# Patient Record
Sex: Male | Born: 1955 | Race: Black or African American | Hispanic: No | Marital: Married | Smoking: Current every day smoker
Health system: Southern US, Community
[De-identification: ages and names within clinical notes are randomized; demographics above are authoritative.]

## PROBLEM LIST (undated history)

## (undated) DIAGNOSIS — M549 Dorsalgia, unspecified: Secondary | ICD-10-CM

## (undated) DIAGNOSIS — I10 Essential (primary) hypertension: Secondary | ICD-10-CM

---

## 2007-09-01 ENCOUNTER — Emergency Department (HOSPITAL_COMMUNITY): Admission: EM | Admit: 2007-09-01 | Discharge: 2007-09-01 | Payer: Self-pay | Admitting: Emergency Medicine

## 2010-12-05 ENCOUNTER — Emergency Department (HOSPITAL_COMMUNITY)
Admission: EM | Admit: 2010-12-05 | Discharge: 2010-12-05 | Disposition: A | Discharge status: Home / Self Care | Payer: Self-pay | Attending: Emergency Medicine | Admitting: Emergency Medicine

## 2010-12-05 DIAGNOSIS — K2970 Gastritis, unspecified, without bleeding: Secondary | ICD-10-CM | POA: Insufficient documentation

## 2010-12-05 DIAGNOSIS — K2990 Gastroduodenitis, unspecified, without bleeding: Secondary | ICD-10-CM | POA: Insufficient documentation

## 2010-12-05 DIAGNOSIS — R1013 Epigastric pain: Secondary | ICD-10-CM | POA: Insufficient documentation

## 2010-12-05 DIAGNOSIS — I10 Essential (primary) hypertension: Secondary | ICD-10-CM | POA: Insufficient documentation

## 2010-12-05 DIAGNOSIS — E875 Hyperkalemia: Secondary | ICD-10-CM | POA: Insufficient documentation

## 2010-12-05 DIAGNOSIS — Z79899 Other long term (current) drug therapy: Secondary | ICD-10-CM | POA: Insufficient documentation

## 2010-12-05 LAB — URINALYSIS, ROUTINE W REFLEX MICROSCOPIC
Ketones, ur: 15 mg/dL — AB
Nitrite: NEGATIVE
Protein, ur: NEGATIVE mg/dL
Urobilinogen, UA: 1 mg/dL (ref 0.0–1.0)

## 2010-12-05 LAB — COMPREHENSIVE METABOLIC PANEL
AST: 50 U/L — ABNORMAL HIGH (ref 0–37)
Alkaline Phosphatase: 69 U/L (ref 39–117)
Calcium: 9.6 mg/dL (ref 8.4–10.5)
GFR calc Af Amer: >60 mL/min (ref >60)
GFR calc non Af Amer: >60 mL/min (ref >60)
Glucose, Bld: 82 mg/dL (ref 70–99)
Sodium: 139 mEq/L (ref 135–145)
Total Bilirubin: 0.5 mg/dL (ref 0.3–1.2)

## 2010-12-05 LAB — DIFFERENTIAL
Basophils Absolute: 0 10*3/uL (ref 0.0–0.1)
Basophils Relative: 1 % (ref 0–1)
Eosinophils Absolute: 0.1 10*3/uL (ref 0.0–0.7)
Eosinophils Relative: 1 % (ref 0–5)
Monocytes Absolute: 0.5 10*3/uL (ref 0.1–1.0)

## 2010-12-05 LAB — CBC
HCT: 47.2 % (ref 39.0–52.0)
MCHC: 35.2 g/dL (ref 30.0–36.0)
Platelets: 321 10*3/uL (ref 150–400)
RDW: 12.1 % (ref 11.5–15.5)

## 2014-04-19 ENCOUNTER — Emergency Department (HOSPITAL_COMMUNITY): Payer: Self-pay

## 2014-04-19 ENCOUNTER — Emergency Department (HOSPITAL_COMMUNITY)
Admission: EM | Admit: 2014-04-19 | Discharge: 2014-04-19 | Disposition: A | Discharge status: Home / Self Care | Payer: Self-pay | Attending: Emergency Medicine | Admitting: Emergency Medicine

## 2014-04-19 ENCOUNTER — Encounter (HOSPITAL_COMMUNITY): Payer: Self-pay | Admitting: Emergency Medicine

## 2014-04-19 DIAGNOSIS — R1013 Epigastric pain: Secondary | ICD-10-CM

## 2014-04-19 DIAGNOSIS — R110 Nausea: Secondary | ICD-10-CM | POA: Insufficient documentation

## 2014-04-19 DIAGNOSIS — Z720 Tobacco use: Secondary | ICD-10-CM | POA: Insufficient documentation

## 2014-04-19 DIAGNOSIS — I10 Essential (primary) hypertension: Secondary | ICD-10-CM | POA: Insufficient documentation

## 2014-04-19 DIAGNOSIS — R1011 Right upper quadrant pain: Secondary | ICD-10-CM

## 2014-04-19 HISTORY — DX: Essential (primary) hypertension: I10

## 2014-04-19 LAB — LIPASE, BLOOD: LIPASE: 30 U/L (ref 11–59)

## 2014-04-19 LAB — CBC WITH DIFFERENTIAL/PLATELET
Basophils Absolute: 0 10*3/uL (ref 0.0–0.1)
Basophils Relative: 1 % (ref 0–1)
EOS ABS: 0.2 10*3/uL (ref 0.0–0.7)
EOS PCT: 4 % (ref 0–5)
HEMATOCRIT: 46.1 % (ref 39.0–52.0)
HEMOGLOBIN: 16.3 g/dL (ref 13.0–17.0)
LYMPHS ABS: 2.2 10*3/uL (ref 0.7–4.0)
Lymphocytes Relative: 45 % (ref 12–46)
MCH: 31.2 pg (ref 26.0–34.0)
MCHC: 35.4 g/dL (ref 30.0–36.0)
MCV: 88.1 fL (ref 78.0–100.0)
MONOS PCT: 12 % (ref 3–12)
Monocytes Absolute: 0.6 10*3/uL (ref 0.1–1.0)
Neutro Abs: 1.8 10*3/uL (ref 1.7–7.7)
Neutrophils Relative %: 38 % — ABNORMAL LOW (ref 43–77)
Platelets: 314 10*3/uL (ref 150–400)
RBC: 5.23 MIL/uL (ref 4.22–5.81)
RDW: 13.4 % (ref 11.5–15.5)
WBC: 4.8 10*3/uL (ref 4.0–10.5)

## 2014-04-19 LAB — URINALYSIS, ROUTINE W REFLEX MICROSCOPIC
BILIRUBIN URINE: NEGATIVE
Glucose, UA: NEGATIVE mg/dL
Hgb urine dipstick: NEGATIVE
Ketones, ur: 15 mg/dL — AB
LEUKOCYTES UA: NEGATIVE
NITRITE: NEGATIVE
PH: 5.5 (ref 5.0–8.0)
Protein, ur: NEGATIVE mg/dL
SPECIFIC GRAVITY, URINE: 1.03 (ref 1.005–1.030)
Urobilinogen, UA: 1 mg/dL (ref 0.0–1.0)

## 2014-04-19 LAB — COMPREHENSIVE METABOLIC PANEL
ALT: 51 U/L (ref 0–53)
AST: 53 U/L — AB (ref 0–37)
Albumin: 3.8 g/dL (ref 3.5–5.2)
Alkaline Phosphatase: 94 U/L (ref 39–117)
Anion gap: 14 (ref 5–15)
BUN: 8 mg/dL (ref 6–23)
CALCIUM: 8.8 mg/dL (ref 8.4–10.5)
CO2: 23 meq/L (ref 19–32)
Chloride: 104 mEq/L (ref 96–112)
Creatinine, Ser: 0.93 mg/dL (ref 0.50–1.35)
GFR calc Af Amer: >90 mL/min (ref >90)
GFR calc non Af Amer: >90 mL/min (ref >90)
Glucose, Bld: 92 mg/dL (ref 70–99)
Potassium: 3.9 mEq/L (ref 3.7–5.3)
SODIUM: 141 meq/L (ref 137–147)
TOTAL PROTEIN: 7.1 g/dL (ref 6.0–8.3)
Total Bilirubin: 0.8 mg/dL (ref 0.3–1.2)

## 2014-04-19 LAB — I-STAT TROPONIN, ED: TROPONIN I, POC: 0.01 ng/mL (ref 0.00–0.08)

## 2014-04-19 LAB — TROPONIN I: Troponin I: <0.3 ng/mL (ref <0.30)

## 2014-04-19 LAB — POC OCCULT BLOOD, ED: Fecal Occult Bld: NEGATIVE

## 2014-04-19 MED ORDER — ONDANSETRON HCL 4 MG/2ML IJ SOLN
4.0000 mg | Freq: Once | INTRAMUSCULAR | Status: AC
  Administered 2014-04-19: 4 mg via INTRAVENOUS
  Filled 2014-04-19: qty 2

## 2014-04-19 MED ORDER — SODIUM CHLORIDE 0.9 % IV SOLN
1000.0000 mL | Freq: Once | INTRAVENOUS | Status: AC
  Administered 2014-04-19: 1000 mL via INTRAVENOUS

## 2014-04-19 MED ORDER — FENTANYL CITRATE 0.05 MG/ML IJ SOLN
50.0000 ug | Freq: Once | INTRAMUSCULAR | Status: AC
  Administered 2014-04-19: 50 ug via INTRAVENOUS
  Filled 2014-04-19: qty 2

## 2014-04-19 MED ORDER — PROMETHAZINE HCL 25 MG PO TABS: 25.0000 mg | ORAL_TABLET | Freq: Four times a day (QID) | ORAL | Status: DC | PRN

## 2014-04-19 MED ORDER — GI COCKTAIL ~~LOC~~
30.0000 mL | Freq: Once | ORAL | Status: AC
  Administered 2014-04-19: 30 mL via ORAL
  Filled 2014-04-19: qty 30

## 2014-04-19 MED ORDER — OMEPRAZOLE 20 MG PO CPDR: 20.0000 mg | DELAYED_RELEASE_CAPSULE | Freq: Every day | ORAL | Status: DC

## 2014-04-19 MED ORDER — MORPHINE SULFATE 4 MG/ML IJ SOLN
4.0000 mg | Freq: Once | INTRAMUSCULAR | Status: AC
  Administered 2014-04-19: 4 mg via INTRAVENOUS
  Filled 2014-04-19: qty 1

## 2014-04-19 MED ORDER — ONDANSETRON HCL 4 MG PO TABS: 4.0000 mg | ORAL_TABLET | Freq: Four times a day (QID) | ORAL | Status: DC

## 2014-04-19 MED ORDER — FAMOTIDINE 20 MG PO TABS
10.0000 mg | ORAL_TABLET | Freq: Once | ORAL | Status: AC
  Administered 2014-04-19: 10 mg via ORAL
  Filled 2014-04-19: qty 1

## 2014-04-19 NOTE — ED Notes (Signed)
Patient transported to Ultrasound 

## 2014-04-19 NOTE — ED Notes (Signed)
Patient presents with c/o abd pain for about 2 weeks  States his stools were green now they are black

## 2014-04-19 NOTE — Discharge Instructions (Signed)
Return to the Emergency Department immediately if you develop pain in your chest.

## 2014-04-19 NOTE — ED Provider Notes (Signed)
CSN: [id number]     Arrival date & time 04/19/14  0428 History   First MD Initiated Contact with Patient 04/19/14 [phone]     Chief Complaint  Patient presents with  . Abdominal Pain     (Consider location/radiation/quality/duration/timing/severity/associated sxs/prior Treatment) HPI Comments: Patient presents today with a chief complaint of epigastric abdominal pain that has been present for the past 2 weeks.  He reports that the pain is constant, but worse after eating.  He describes the pain as a burning sensation.  The pain does not radiate.  He reports that he has never had pain like this previously. He has not taken anything for his pain prior to arrival.  He reports nausea, but denies vomiting.  He reports that yesterday his BM appeared black in color.  He denies diarrhea or constipation.  Last BM was yesterday.  He denies fever or chills.  Denies chest pain or SOB.  He denies known history of Pancreatitis, Gallstones, or PUD.  The history is provided by the patient.    Past Medical History  Diagnosis Date  . Hypertension    History reviewed. No pertinent past surgical history. History reviewed. No pertinent family history. History  Substance Use Topics  . Smoking status: Current Every Day Smoker  . Smokeless tobacco: Never Used  . Alcohol Use: Yes    Review of Systems  All other systems reviewed and are negative.     Allergies  Review of patient's allergies indicates no known allergies.  Home Medications   Prior to Admission medications   Medication Sig Start Date End Date Taking? Authorizing Provider  ibuprofen (ADVIL,MOTRIN) 200 MG tablet Take 400 mg by mouth every 6 (six) hours as needed for mild pain.   Yes Historical Provider, MD   BP 128/93  Pulse 70  Temp(Src) 97.9 F (36.6 C) (Oral)  Resp 21  Ht 6' (1.829 m)  Wt 176 lb (79.833 kg)  BMI 23.86 kg/m2  SpO2 100% Physical Exam  Nursing note and vitals reviewed. Constitutional: He appears well-developed  and well-nourished.  HENT:  Head: Normocephalic and atraumatic.  Mouth/Throat: Oropharynx is clear and moist.  Neck: Normal range of motion. Neck supple.  Cardiovascular: Normal rate, regular rhythm and normal heart sounds.   Pulmonary/Chest: Effort normal and breath sounds normal.  Abdominal: Soft. Bowel sounds are normal. He exhibits no distension and no mass. There is tenderness in the right upper quadrant and epigastric area. There is no rebound and no guarding.  Genitourinary: Rectal exam shows no external hemorrhoid. Guaiac negative stool.  Neurological: He is alert.  Skin: Skin is warm and dry.  Psychiatric: He has a normal mood and affect.    ED Course  Procedures (including critical care time) Labs Review Labs Reviewed  COMPREHENSIVE METABOLIC PANEL - Abnormal; Notable for the following:    AST 53 (*)    All other components within normal limits  CBC WITH DIFFERENTIAL - Abnormal; Notable for the following:    Neutrophils Relative % 38 (*)    All other components within normal limits  TROPONIN I  LIPASE, BLOOD  URINALYSIS, ROUTINE W REFLEX MICROSCOPIC  OCCULT BLOOD X 1 CARD TO LAB, STOOL    Imaging Review [place] Abdomen Limited Ruq  04/19/2014   CLINICAL DATA:  RIGHT upper quadrant abdominal pain.  EXAM: US ABDOMEN LIMITED - RIGHT UPPER QUADRANT  COMPARISON:  None.  FINDINGS: Gallbladder:  Echogenic biliary sludge is present. No stones. Normal wall thickness. No sonographic Murphy sign.  Common  bile duct:  Diameter: Normal at 4 mm.  Liver:  Focal hyperechoic lesion is identified in the RIGHT hepatic lobe anteriorly, measuring 15 mm x 17 mm x 13 mm. The margins blend with surrounding parenchyma and there is no distortion of the associated vessels. This is an isolated finding.  IMPRESSION: 1. Biliary sludge.  Negative for cholelithiasis or cholecystitis. 2. 15 mm x 17 mm x 13 mm incidental RIGHT hepatic lobe hyperechoic lesion is nonspecific on ultrasound. Statistically, this  either likely represents focal fat or a benign hemangioma. In a patient without a given history of malignancy, 6 month followup ultrasound recommended to assess for stability.   Electronically Signed   By: Andreas NewportGeoffrey  Lamke M.D.   On: 04/19/2014 08:21     EKG Interpretation   Date/Time:  Monday April 19 2014 05:25:32 EDT Ventricular Rate:  72 PR Interval:  146 QRS Duration: 90 QT Interval:  398 QTC Calculation: 435 R Axis:   81 Text Interpretation:  Normal sinus rhythm `prominent/peaked  t wave v3-v4  Nonspecific ST abnormality No previous tracing Confirmed by Denton LankSTEINL  MD,  Caryn BeeKEVIN ([id number]) on 04/19/2014 9:17:58 AM     9:12 AM Patient reports that his pain has improved at this time. 10:50 AM Reassessed patient.  He reports that his pain is "much better."   Second troponin negative.  MDM   Final diagnoses:  RUQ abdominal pain   Patient presents today with a "burning" epigastric abdominal pain that has been present for the past 2 weeks.  Pain worse after eating.  Labs today unremarkable.  Ultrasound with no acute findings.  Patient informed of the right hepatic lobe lesion and the need for follow up.  EKG with non specific ST abnormality.  Initial and 3 hour troponin negative.  Pain improved significantly after given GI cocktail and Pepcid.  Feel that the patient is stable for discharge.  Patient given referral to GI.  Return precautions given.      Santiago GladHeather Shacoya Burkhammer, PA-C 04/20/14 2159

## 2014-04-19 NOTE — Discharge Planning (Signed)
[facility] Community Health & Eligibility Specialist  Spoke to patient regarding primary care resources and establishing care with a provider. Orange card application and instructions provided to the patient. Resource guide and my contact information also given for any future questions or concerns. No other Community Health & Eligibility Specialist needs identified at this time.

## 2014-04-21 NOTE — ED Provider Notes (Signed)
Medical screening examination/treatment/procedure(s) were performed by non-physician practitioner and as supervising physician I was immediately available for consultation/collaboration.   EKG Interpretation   Date/Time:  Monday April 19 2014 05:25:32 EDT Ventricular Rate:  72 PR Interval:  146 QRS Duration: 90 QT Interval:  398 QTC Calculation: 435 R Axis:   81 Text Interpretation:  Normal sinus rhythm `prominent/peaked  t wave v3-v4  Nonspecific ST abnormality No previous tracing Confirmed by Denton LankSTEINL  MD,  Caryn BeeKEVIN ([id number]) on 04/19/2014 9:17:58 AM        Shon Batonourtney F Emmalena Canny, MD 04/21/14 [phone]

## 2014-05-22 ENCOUNTER — Encounter (HOSPITAL_COMMUNITY): Payer: Self-pay | Admitting: Nurse Practitioner

## 2014-05-22 ENCOUNTER — Emergency Department (HOSPITAL_COMMUNITY)
Admission: EM | Admit: 2014-05-22 | Discharge: 2014-05-22 | Disposition: A | Discharge status: Home / Self Care | Payer: Self-pay | Attending: Emergency Medicine | Admitting: Emergency Medicine

## 2014-05-22 DIAGNOSIS — S199XXA Unspecified injury of neck, initial encounter: Secondary | ICD-10-CM

## 2014-05-22 DIAGNOSIS — Z720 Tobacco use: Secondary | ICD-10-CM | POA: Insufficient documentation

## 2014-05-22 DIAGNOSIS — Y9389 Activity, other specified: Secondary | ICD-10-CM | POA: Insufficient documentation

## 2014-05-22 DIAGNOSIS — S169XXA Unspecified injury of muscle, fascia and tendon at neck level, initial encounter: Secondary | ICD-10-CM | POA: Insufficient documentation

## 2014-05-22 DIAGNOSIS — Y998 Other external cause status: Secondary | ICD-10-CM | POA: Insufficient documentation

## 2014-05-22 DIAGNOSIS — I10 Essential (primary) hypertension: Secondary | ICD-10-CM | POA: Insufficient documentation

## 2014-05-22 DIAGNOSIS — Z79899 Other long term (current) drug therapy: Secondary | ICD-10-CM | POA: Insufficient documentation

## 2014-05-22 DIAGNOSIS — S39012A Strain of muscle, fascia and tendon of lower back, initial encounter: Secondary | ICD-10-CM | POA: Insufficient documentation

## 2014-05-22 DIAGNOSIS — Y92410 Unspecified street and highway as the place of occurrence of the external cause: Secondary | ICD-10-CM | POA: Insufficient documentation

## 2014-05-22 DIAGNOSIS — S1091XA Abrasion of unspecified part of neck, initial encounter: Secondary | ICD-10-CM | POA: Insufficient documentation

## 2014-05-22 MED ORDER — NAPROXEN 500 MG PO TABS: 500.0000 mg | ORAL_TABLET | Freq: Two times a day (BID) | ORAL | Status: DC

## 2014-05-22 MED ORDER — HYDROCODONE-ACETAMINOPHEN 5-325 MG PO TABS: 1.0000 | ORAL_TABLET | Freq: Four times a day (QID) | ORAL | Status: DC | PRN

## 2014-05-22 MED ORDER — CYCLOBENZAPRINE HCL 10 MG PO TABS: 10.0000 mg | ORAL_TABLET | Freq: Three times a day (TID) | ORAL | Status: DC | PRN
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
## 2014-05-31 ENCOUNTER — Ambulatory Visit (HOSPITAL_COMMUNITY)
Admission: EM | Admit: 2014-05-31 | Discharge: 2014-05-31 | Disposition: A | Discharge status: Home / Self Care | Payer: Self-pay | Attending: Emergency Medicine | Admitting: Emergency Medicine

## 2014-05-31 ENCOUNTER — Emergency Department (HOSPITAL_COMMUNITY): Payer: Self-pay | Admitting: Certified Registered Nurse Anesthetist

## 2014-05-31 ENCOUNTER — Emergency Department (HOSPITAL_COMMUNITY): Payer: Self-pay

## 2014-05-31 ENCOUNTER — Encounter (HOSPITAL_COMMUNITY): Payer: Self-pay | Admitting: Family Medicine

## 2014-05-31 ENCOUNTER — Encounter (HOSPITAL_COMMUNITY)
Admission: EM | Disposition: A | Discharge status: Home / Self Care | Payer: Self-pay | Source: Home / Self Care | Attending: Emergency Medicine

## 2014-05-31 DIAGNOSIS — S62522B Displaced fracture of distal phalanx of left thumb, initial encounter for open fracture: Secondary | ICD-10-CM | POA: Insufficient documentation

## 2014-05-31 DIAGNOSIS — X58XXXA Exposure to other specified factors, initial encounter: Secondary | ICD-10-CM | POA: Insufficient documentation

## 2014-05-31 DIAGNOSIS — I10 Essential (primary) hypertension: Secondary | ICD-10-CM | POA: Insufficient documentation

## 2014-05-31 DIAGNOSIS — S61112A Laceration without foreign body of left thumb with damage to nail, initial encounter: Secondary | ICD-10-CM | POA: Insufficient documentation

## 2014-05-31 DIAGNOSIS — F17210 Nicotine dependence, cigarettes, uncomplicated: Secondary | ICD-10-CM | POA: Insufficient documentation

## 2014-05-31 DIAGNOSIS — Z01810 Encounter for preprocedural cardiovascular examination: Secondary | ICD-10-CM

## 2014-05-31 DIAGNOSIS — S62639B Displaced fracture of distal phalanx of unspecified finger, initial encounter for open fracture: Secondary | ICD-10-CM

## 2014-05-31 DIAGNOSIS — Y929 Unspecified place or not applicable: Secondary | ICD-10-CM | POA: Insufficient documentation

## 2014-05-31 HISTORY — PX: TENDON EXPLORATION: SHX5112

## 2014-05-31 LAB — BASIC METABOLIC PANEL
ANION GAP: 13 (ref 5–15)
BUN: 9 mg/dL (ref 6–23)
CALCIUM: 9.5 mg/dL (ref 8.4–10.5)
CO2: 26 mEq/L (ref 19–32)
Chloride: 102 mEq/L (ref 96–112)
Creatinine, Ser: 0.96 mg/dL (ref 0.50–1.35)
GFR calc Af Amer: >90 mL/min (ref >90)
GFR calc non Af Amer: >90 mL/min (ref >90)
Glucose, Bld: 81 mg/dL (ref 70–99)
Potassium: 4 mEq/L (ref 3.7–5.3)
SODIUM: 141 meq/L (ref 137–147)

## 2014-05-31 LAB — CBC WITH DIFFERENTIAL/PLATELET
BASOS ABS: 0 10*3/uL (ref 0.0–0.1)
Basophils Relative: 0 % (ref 0–1)
EOS PCT: 2 % (ref 0–5)
Eosinophils Absolute: 0.1 10*3/uL (ref 0.0–0.7)
HCT: 46.5 % (ref 39.0–52.0)
Hemoglobin: 15.8 g/dL (ref 13.0–17.0)
LYMPHS PCT: 28 % (ref 12–46)
Lymphs Abs: 1.8 10*3/uL (ref 0.7–4.0)
MCH: 31.3 pg (ref 26.0–34.0)
MCHC: 34 g/dL (ref 30.0–36.0)
MCV: 92.3 fL (ref 78.0–100.0)
Monocytes Absolute: 0.7 10*3/uL (ref 0.1–1.0)
Monocytes Relative: 10 % (ref 3–12)
NEUTROS ABS: 3.8 10*3/uL (ref 1.7–7.7)
Neutrophils Relative %: 60 % (ref 43–77)
PLATELETS: 273 10*3/uL (ref 150–400)
RBC: 5.04 MIL/uL (ref 4.22–5.81)
RDW: 13.3 % (ref 11.5–15.5)
WBC: 6.4 10*3/uL (ref 4.0–10.5)

## 2014-05-31 SURGERY — EXPLORATION, TENDON
Anesthesia: General | Site: Thumb | Laterality: Left

## 2014-05-31 MED ORDER — TETANUS-DIPHTH-ACELL PERTUSSIS 5-2.5-18.5 LF-MCG/0.5 IM SUSP: 0.5000 mL | Freq: Once | INTRAMUSCULAR | Status: DC

## 2014-05-31 MED ORDER — LIDOCAINE HCL (CARDIAC) 20 MG/ML IV SOLN
INTRAVENOUS | Status: AC
  Filled 2014-05-31: qty 5

## 2014-05-31 MED ORDER — BUPIVACAINE HCL (PF) 0.25 % IJ SOLN
INTRAMUSCULAR | Status: DC | PRN
  Administered 2014-05-31: 5 mL

## 2014-05-31 MED ORDER — BUPIVACAINE HCL (PF) 0.25 % IJ SOLN
INTRAMUSCULAR | Status: AC
  Filled 2014-05-31: qty 30

## 2014-05-31 MED ORDER — FENTANYL CITRATE 0.05 MG/ML IJ SOLN
INTRAMUSCULAR | Status: DC | PRN
  Administered 2014-05-31: 100 ug via INTRAVENOUS
  Administered 2014-05-31: 50 ug via INTRAVENOUS

## 2014-05-31 MED ORDER — CEFAZOLIN SODIUM-DEXTROSE 2-3 GM-% IV SOLR
2.0000 g | INTRAVENOUS | Status: AC
  Administered 2014-05-31: 2 g via INTRAVENOUS
  Filled 2014-05-31: qty 50

## 2014-05-31 MED ORDER — EPHEDRINE SULFATE 50 MG/ML IJ SOLN
INTRAMUSCULAR | Status: DC | PRN
  Administered 2014-05-31 (×2): 10 mg via INTRAVENOUS

## 2014-05-31 MED ORDER — CEFAZOLIN SODIUM-DEXTROSE 2-3 GM-% IV SOLR
INTRAVENOUS | Status: DC | PRN
  Administered 2014-05-31: 2 g via INTRAVENOUS

## 2014-05-31 MED ORDER — PROPOFOL 10 MG/ML IV BOLUS
INTRAVENOUS | Status: AC
  Filled 2014-05-31: qty 20

## 2014-05-31 MED ORDER — ONDANSETRON HCL 4 MG/2ML IJ SOLN
INTRAMUSCULAR | Status: AC
  Filled 2014-05-31: qty 2

## 2014-05-31 MED ORDER — PROPOFOL 10 MG/ML IV BOLUS
INTRAVENOUS | Status: DC | PRN
  Administered 2014-05-31: 130 mg via INTRAVENOUS

## 2014-05-31 MED ORDER — MIDAZOLAM HCL 5 MG/5ML IJ SOLN
INTRAMUSCULAR | Status: DC | PRN
  Administered 2014-05-31: 2 mg via INTRAVENOUS

## 2014-05-31 MED ORDER — OXYCODONE-ACETAMINOPHEN 5-325 MG PO TABS: 1.0000 | ORAL_TABLET | ORAL | Status: DC | PRN

## 2014-05-31 MED ORDER — SUCCINYLCHOLINE CHLORIDE 20 MG/ML IJ SOLN
INTRAMUSCULAR | Status: DC | PRN
  Administered 2014-05-31: 100 mg via INTRAVENOUS

## 2014-05-31 MED ORDER — ONDANSETRON HCL 4 MG/2ML IJ SOLN
INTRAMUSCULAR | Status: DC | PRN
  Administered 2014-05-31: 4 mg via INTRAVENOUS

## 2014-05-31 MED ORDER — SUCCINYLCHOLINE CHLORIDE 20 MG/ML IJ SOLN
INTRAMUSCULAR | Status: AC
  Filled 2014-05-31: qty 1

## 2014-05-31 MED ORDER — HYDROCODONE-ACETAMINOPHEN 5-325 MG PO TABS
2.0000 | ORAL_TABLET | Freq: Once | ORAL | Status: AC
  Administered 2014-05-31: 2 via ORAL
  Filled 2014-05-31: qty 2

## 2014-05-31 MED ORDER — ARTIFICIAL TEARS OP OINT
TOPICAL_OINTMENT | OPHTHALMIC | Status: AC
  Filled 2014-05-31: qty 3.5

## 2014-05-31 MED ORDER — HYDROMORPHONE HCL 1 MG/ML IJ SOLN
1.0000 mg | INTRAMUSCULAR | Status: DC | PRN
Start: 2014-05-31 — End: 2014-06-01
  Administered 2014-05-31: 1 mg via INTRAVENOUS
  Filled 2014-05-31: qty 1

## 2014-05-31 MED ORDER — LIDOCAINE HCL (CARDIAC) 20 MG/ML IV SOLN
INTRAVENOUS | Status: DC | PRN
  Administered 2014-05-31: 30 mg via INTRAVENOUS

## 2014-05-31 MED ORDER — ROCURONIUM BROMIDE 50 MG/5ML IV SOLN
INTRAVENOUS | Status: AC
  Filled 2014-05-31: qty 1

## 2014-05-31 MED ORDER — FENTANYL CITRATE 0.05 MG/ML IJ SOLN
INTRAMUSCULAR | Status: AC
  Filled 2014-05-31: qty 5

## 2014-05-31 MED ORDER — LACTATED RINGERS IV SOLN
INTRAVENOUS | Status: DC | PRN
  Administered 2014-05-31: 19:00:00 via INTRAVENOUS

## 2014-05-31 MED ORDER — LACTATED RINGERS IV SOLN
INTRAVENOUS | Status: DC
  Administered 2014-05-31: 18:00:00 via INTRAVENOUS

## 2014-05-31 MED ORDER — MIDAZOLAM HCL 2 MG/2ML IJ SOLN
INTRAMUSCULAR | Status: AC
  Filled 2014-05-31: qty 2

## 2014-05-31 SURGICAL SUPPLY — 47 items
BANDAGE ELASTIC 4 VELCRO ST LF (GAUZE/BANDAGES/DRESSINGS) IMPLANT
BNDG CMPR 9X4 STRL LF SNTH (GAUZE/BANDAGES/DRESSINGS) ×1
BNDG ESMARK 4X9 LF (GAUZE/BANDAGES/DRESSINGS) ×3 IMPLANT
BNDG GAUZE ELAST 4 BULKY (GAUZE/BANDAGES/DRESSINGS) IMPLANT
CLOSURE WOUND 1/2 X4 (GAUZE/BANDAGES/DRESSINGS)
CORDS BIPOLAR (ELECTRODE) ×3 IMPLANT
COVER SURGICAL LIGHT HANDLE (MISCELLANEOUS) ×3 IMPLANT
CUFF TOURNIQUET SINGLE 18IN (TOURNIQUET CUFF) IMPLANT
CUFF TOURNIQUET SINGLE 24IN (TOURNIQUET CUFF) IMPLANT
DECANTER SPIKE VIAL GLASS SM (MISCELLANEOUS) IMPLANT
DRAPE OEC MINIVIEW 54X84 (DRAPES) ×2 IMPLANT
DRAPE SURG 17X23 STRL (DRAPES) ×3 IMPLANT
DURAPREP 26ML APPLICATOR (WOUND CARE) ×3 IMPLANT
GAUZE SPONGE 4X4 12PLY STRL (GAUZE/BANDAGES/DRESSINGS) IMPLANT
GAUZE XEROFORM 1X8 LF (GAUZE/BANDAGES/DRESSINGS) IMPLANT
GLOVE BIO SURGEON STRL SZ8.5 (GLOVE) IMPLANT
GOWN STRL REUS W/ TWL LRG LVL3 (GOWN DISPOSABLE) ×1 IMPLANT
GOWN STRL REUS W/ TWL XL LVL3 (GOWN DISPOSABLE) ×1 IMPLANT
GOWN STRL REUS W/TWL LRG LVL3 (GOWN DISPOSABLE) ×3
GOWN STRL REUS W/TWL XL LVL3 (GOWN DISPOSABLE) ×3
K-WIRE .035X5.75  STT K (WIRE) ×3 IMPLANT
KIT BASIN OR (CUSTOM PROCEDURE TRAY) ×3 IMPLANT
KIT ROOM TURNOVER OR (KITS) ×3 IMPLANT
MANIFOLD NEPTUNE II (INSTRUMENTS) ×3 IMPLANT
NEEDLE HYPO 25GX1X1/2 BEV (NEEDLE) IMPLANT
NS IRRIG 1000ML POUR BTL (IV SOLUTION) ×3 IMPLANT
PACK ORTHO EXTREMITY (CUSTOM PROCEDURE TRAY) ×3 IMPLANT
PAD ARMBOARD 7.5X6 YLW CONV (MISCELLANEOUS) ×6 IMPLANT
PAD CAST 4YDX4 CTTN HI CHSV (CAST SUPPLIES) IMPLANT
PADDING CAST COTTON 4X4 STRL (CAST SUPPLIES)
STRIP CLOSURE SKIN 1/2X4 (GAUZE/BANDAGES/DRESSINGS) IMPLANT
SUT CHROMIC 6 0 PS 4 (SUTURE) ×2 IMPLANT
SUT ETHIBOND 3-0 V-5 (SUTURE) IMPLANT
SUT ETHILON 5 0 PS 2 18 (SUTURE) IMPLANT
SUT PROLENE 3 0 PS 2 (SUTURE) IMPLANT
SUT SILK 4 0 PS 2 (SUTURE) IMPLANT
SUT VIC AB 3-0 FS2 27 (SUTURE) IMPLANT
SUT VIC AB 4-0 P-3 18X BRD (SUTURE) IMPLANT
SUT VIC AB 4-0 P3 18 (SUTURE)
SUT VICRYL RAPIDE 4/0 PS 2 (SUTURE) ×2 IMPLANT
SYR CONTROL 10ML LL (SYRINGE) IMPLANT
TOWEL OR 17X24 6PK STRL BLUE (TOWEL DISPOSABLE) ×3 IMPLANT
TOWEL OR 17X26 10 PK STRL BLUE (TOWEL DISPOSABLE) ×3 IMPLANT
TUBE CONNECTING 12'X1/4 (SUCTIONS)
TUBE CONNECTING 12X1/4 (SUCTIONS) IMPLANT
UNDERPAD 30X30 INCONTINENT (UNDERPADS AND DIAPERS) ×3 IMPLANT
WATER STERILE IRR 1000ML POUR (IV SOLUTION) ×3 IMPLANT
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
## 2014-05-31 NOTE — ED Provider Notes (Signed)
CSN: [id number]     Arrival date & time 05/31/14  1353 History  This chart was scribed for non-physician practitioner, Allen DerryMercedes Camprubi-Soms, PA-C, working with Samuel JesterKathleen McManus, DO by Charline BillsEssence Howell, ED Scribe. This patient was seen in room TR06C/TR06C and the patient's care was started at 2:33 PM.   Chief Complaint  Patient presents with  . Extremity Laceration   Patient is a 58 y.o. male presenting with skin laceration. The history is provided by the patient. No language interpreter was used.  Laceration Location:  Hand Hand laceration location:  L finger Length (cm):  Across entire thumb Depth:  Through underlying tissue Quality: avulsion   Bleeding: venous and controlled with pressure   Time since incident:  35 minutes Laceration mechanism:  Metal edge Pain details:    Quality:  Sharp, throbbing and numbness   Severity:  Severe   Timing:  Constant   Progression:  Unchanged Foreign body present:  Unable to specify Relieved by:  None tried Worsened by:  Movement Ineffective treatments:  None tried Tetanus status:  Up to date  HPI Comments: Kenneth Andersen is a 58 y.o. male with a PMHx of HTN, who presents to the Emergency Department complaining of extremity laceration sustained approximately 35 minutes PTA. Pt states that he was splitting wood when he cut his L thumb on a metallic edge of the cutter. He states his finger got caught in it, and he withdrew the hand when he sensed the pain which caused his thumb to be torn open. Pt reports a constant, sharp and throbbing pain to the distal L thumb that radiates to his L index and middle fingers. He currently rates his pain 10/10 and states it's constant. Pain is exacerbated with bending finger and touching the area. No known alleviating factors, since he has not tried any medications PTA. Pt reports associated numbness to distal tip of thumb. Bleeding has been oozing but is somewhat controlled by pressure. He denies wrist pain or weakness,  loss of ROM to thumb, injury to other fingers/hand/areas, CP, SOB, abd pain, n/v, lightheadedness, or dizziness. No h/o anti-coagulant use or bleeding disorders. Tetanus UTD, he states he received one earlier this year while in prison.  No known allergies.   Past Medical History  Diagnosis Date  . Hypertension    History reviewed. No pertinent past surgical history. History reviewed. No pertinent family history. History  Substance Use Topics  . Smoking status: Current Every Day Smoker  . Smokeless tobacco: Never Used  . Alcohol Use: Yes    Review of Systems  Respiratory: Negative for shortness of breath.   Cardiovascular: Negative for chest pain.  Gastrointestinal: Negative for nausea, vomiting and abdominal pain.  Musculoskeletal: Positive for arthralgias. Negative for myalgias, back pain and neck pain.  Skin: Positive for wound.  Allergic/Immunologic: Negative for immunocompromised state.  Neurological: Positive for numbness. Negative for dizziness, syncope, weakness and light-headedness.  Hematological: Does not bruise/bleed easily.  10 Systems reviewed and all are negative for acute change except as noted in the HPI.  Allergies  Review of patient's allergies indicates no known allergies.  Home Medications   Prior to Admission medications   Medication Sig Start Date End Date Taking? Authorizing Provider  cyclobenzaprine (FLEXERIL) 10 MG tablet Take 1 tablet (10 mg total) by mouth 3 (three) times daily as needed for muscle spasms. 05/22/14   Arthor CaptainAbigail Harris, PA-C  HYDROcodone-acetaminophen (NORCO) 5-325 MG per tablet Take 1-2 tablets by mouth every 6 (six) hours as needed for  moderate pain. 05/22/14   Arthor CaptainAbigail Harris, PA-C  ibuprofen (ADVIL,MOTRIN) 200 MG tablet Take 400 mg by mouth every 6 (six) hours as needed for mild pain.    Historical Provider, MD  naproxen (NAPROSYN) 500 MG tablet Take 1 tablet (500 mg total) by mouth 2 (two) times daily with a meal. 05/22/14   Arthor CaptainAbigail  Harris, PA-C  omeprazole (PRILOSEC) 20 MG capsule Take 1 capsule (20 mg total) by mouth daily. 04/19/14   Heather Laisure, PA-C  omeprazole (PRILOSEC) 20 MG capsule Take 1 capsule (20 mg total) by mouth daily. 04/19/14   Heather Laisure, PA-C  ondansetron (ZOFRAN) 4 MG tablet Take 1 tablet (4 mg total) by mouth every 6 (six) hours. 04/19/14   Heather Laisure, PA-C  promethazine (PHENERGAN) 25 MG tablet Take 1 tablet (25 mg total) by mouth every 6 (six) hours as needed for nausea. 04/19/14   Santiago GladHeather Laisure, PA-C   Triage Vitals: BP 146/94 mmHg  Pulse 84  Temp(Src) 98.6 F (37 C)  Resp 18  SpO2 98% Physical Exam  Constitutional: He is oriented to person, place, and time. Vital signs are normal. He appears well-developed and well-nourished.  Non-toxic appearance. No distress.  Afebrile, nontoxic, NAD and surprisingly calm and composed. VSS.   HENT:  Head: Normocephalic and atraumatic.  Mouth/Throat: Mucous membranes are normal.  Eyes: Conjunctivae and EOM are normal.  Neck: Normal range of motion. Neck supple.  Cardiovascular: Normal rate and intact distal pulses.   Distal pulses intact. Difficult to assess cap refill in L thumb due to injury/pain and bleeding  Pulmonary/Chest: Effort normal. No respiratory distress.  Abdominal: Normal appearance. He exhibits no distension.  Musculoskeletal: Normal range of motion.       Left hand: He exhibits tenderness, bony tenderness, disruption of two-point discrimination, deformity and laceration. He exhibits normal range of motion. Decreased sensation (to distal L thumb) noted. Normal strength noted.       Hands: L thumb laceration across dorsal aspect of distal tip, extending from just distal to DIP joint, going across entirety of thumb, and depth approx half of width of thumb. Ongoing oozing blood, no arterial bleeding. Difficult to visualize bone/underlying involvement, but distal tip without sensation and with abnormal mobility/crepitus. Cap  refill difficult to assess due to pain and bleeding. Full flexion/extension of DIP and CMC joint intact. Strength of hand, wrist, and arm 5/5 bilaterally. SEE PICTURE BELOW  Neurological: He is alert and oriented to person, place, and time. He has normal strength. A sensory deficit is present.  Skin: Skin is warm and dry. Laceration noted.  Laceration as noted above, and as depicted in the image below  Psychiatric: He has a normal mood and affect. His behavior is normal.  Nursing note and vitals reviewed.    ED Course  Procedures (including critical care time) DIAGNOSTIC STUDIES: Oxygen Saturation is 98% on RA, normal by my interpretation.    COORDINATION OF CARE: 2:41 PM-Discussed treatment plan which includes XR and consult with hand specialist with pt at bedside and pt agreed to plan.   Labs Review Labs Reviewed - No data to display  Imaging Review Dg Finger Thumb Left  05/31/2014   CLINICAL DATA:  Left thumb laceration.  EXAM: LEFT THUMB 2+V  COMPARISON:  None.  FINDINGS: Moderate displaced comminuted fracture involving the distal tuft of the first distal phalanx is noted with associated soft tissue laceration. No radiopaque foreign body is noted. Joint spaces are intact.  IMPRESSION: Moderately displaced comminuted fracture involving the  distal tuft of the first distal phalanx with associated soft tissue laceration.   Electronically Signed   By: Roque Lias M.D.   On: 05/31/2014 15:59     EKG Interpretation None      MDM   Final diagnoses:  Open fracture of tuft of distal phalanx of finger, initial encounter    58y/o male with laceration of distal L thumb resulting in partial amputation. Tendons intact with FROM to flexion/extension of DIP joint, but sensation lost distal to injury. Cap refill difficult to assess due to pain and ongoing bleeding. No arterial injury, venous bleeding ongoing but relatively controlled with pressure. Tetanus up to date. Decision was made to  consult Dr. Mina Marble for surgical intervention given neurovascular injury and the extend of this injury, in addition to the fact that on exam it felt as though there would be a fracture to distal tip although bone is not able to be visualized due to bleeding. Xray ordered but radiology backed up. Molly Maduro (PA-C for Dr. Mina Marble) returing page before xray was done, discussed that intervention would depend on xray and would call back when xray was done. Called Radiology to please expedite the xray if possible. Pain meds given with mild relief, pt stable at this time. Will reassess once xrays are obtained.   3:59 PM Unfortunately xray took longer than anticipated, but result showing moderately displaced comminutted fx of distal tuft. Will start IV, fluids, get CBC/BMP/EKG/CXR for pre op, and start ancef. Called Robert at Dr. Ronie Spies office back, stated they will be over to take him to the OR tonight. Appreciate greatly their support. Discussed plan of care with pt who is prepared and understands, has no questions. Please see Dr. Ronie Spies dictation for further documentation of care for this patient. Admit status placed but no orders started aside from PRN pain medications and those listed above. Pt stable and comfortable at this time.   I personally performed the services described in this documentation, which was scribed in my presence. The recorded information has been reviewed and is accurate.  BP 146/94 mmHg  Pulse 84  Temp(Src) 98.6 F (37 C)  Resp 18  SpO2 98%  Meds ordered this encounter  Medications  . DISCONTD: Tdap (BOOSTRIX) injection 0.5 mL    Sig:   . HYDROcodone-acetaminophen (NORCO/VICODIN) 5-325 MG per tablet 2 tablet    Sig:   . ceFAZolin (ANCEF) IVPB 2 g/50 mL premix    Sig:     Order Specific Question:  Antibiotic Indication:    Answer:  Surgical Prophylaxis  . HYDROmorphone (DILAUDID) injection 1 mg    Sig:        [place] Camprubi-Soms, PA-C 05/31/14  1709  Samuel Jester, DO 06/01/14 1646

## 2014-05-31 NOTE — Anesthesia Postprocedure Evaluation (Signed)
  Anesthesia Post-op Note  Patient: Kenneth Andersen  Procedure(s) Performed: Procedure(s): ORIF LEFT THUMB WITH NAIL BED REPAIR (Left)  Patient Location: PACU  Anesthesia Type:General  Level of Consciousness: awake and alert   Airway and Oxygen Therapy: Patient Spontanous Breathing  Post-op Pain: none  Post-op Assessment: Post-op Vital signs reviewed, Patient's Cardiovascular Status Stable and Respiratory Function Stable  Post-op Vital Signs: Reviewed  Filed Vitals:   05/31/14 2053  BP: 148/88  Pulse:   Temp: 36.4 C  Resp:     Complications: No apparent anesthesia complications

## 2014-05-31 NOTE — Transfer of Care (Signed)
Immediate Anesthesia Transfer of Care Note  Patient: Kenneth Andersen  Procedure(s) Performed: Procedure(s): ORIF LEFT THUMB WITH NAIL BED REPAIR (Left)  Patient Location: PACU  Anesthesia Type:General  Level of Consciousness: awake, alert  and oriented  Airway & Oxygen Therapy: Patient Spontanous Breathing and Patient connected to nasal cannula oxygen  Post-op Assessment: Report given to PACU RN, Post -op Vital signs reviewed and stable and Patient moving all extremities X 4  Post vital signs: Reviewed and stable  Complications: No apparent anesthesia complications

## 2014-05-31 NOTE — Anesthesia Procedure Notes (Signed)
Procedure Name: Intubation Date/Time: 05/31/2014 7:00 PM Performed by: Molli HazardGORDON, Ryver Zadrozny M Pre-anesthesia Checklist: Patient identified, Emergency Drugs available, Suction available and Patient being monitored Patient Re-evaluated:Patient Re-evaluated prior to inductionOxygen Delivery Method: Circle system utilized Preoxygenation: Pre-oxygenation with 100% oxygen Intubation Type: IV induction, Rapid sequence and Cricoid Pressure applied Laryngoscope Size: Miller and 2 Grade View: Grade I Tube type: Oral Tube size: 7.5 mm Number of attempts: 1 Airway Equipment and Method: Stylet Placement Confirmation: ETT inserted through vocal cords under direct vision,  positive ETCO2 and breath sounds checked- equal and bilateral Secured at: 22 cm Tube secured with: Tape Dental Injury: Teeth and Oropharynx as per pre-operative assessment

## 2014-05-31 NOTE — Consult Note (Signed)
Reason for Consult:left thumb injury Referring Physician: [name] is an 58 y.o. male.  HPI: s/p saw injury to left thumb with complex open dorsal wound and open distal phalanx fracture  Past Medical History  Diagnosis Date  . Hypertension     History reviewed. No pertinent past surgical history.  History reviewed. No pertinent family history.  Social History:  reports that he has been smoking.  He has never used smokeless tobacco. He reports that he drinks alcohol. He reports that he does not use illicit drugs.  Allergies: No Known Allergies  Medications: Scheduled:   Results for orders placed or performed during the hospital encounter of 05/31/14 (from the past 48 hour(s))  CBC with Differential     Status: None   Collection Time: 05/31/14  4:29 PM  Result Value Ref Range   WBC 6.4 4.0 - 10.5 K/uL   RBC 5.04 4.22 - 5.81 MIL/uL   Hemoglobin 15.8 13.0 - 17.0 g/dL   HCT 46.5 39.0 - 52.0 %   MCV 92.3 78.0 - 100.0 fL   MCH 31.3 26.0 - 34.0 pg   MCHC 34.0 30.0 - 36.0 g/dL   RDW 13.3 11.5 - 15.5 %   Platelets 273 150 - 400 K/uL   Neutrophils Relative % 60 43 - 77 %   Neutro Abs 3.8 1.7 - 7.7 K/uL   Lymphocytes Relative 28 12 - 46 %   Lymphs Abs 1.8 0.7 - 4.0 K/uL   Monocytes Relative 10 3 - 12 %   Monocytes Absolute 0.7 0.1 - 1.0 K/uL   Eosinophils Relative 2 0 - 5 %   Eosinophils Absolute 0.1 0.0 - 0.7 K/uL   Basophils Relative 0 0 - 1 %   Basophils Absolute 0.0 0.0 - 0.1 K/uL  Basic metabolic panel     Status: None   Collection Time: 05/31/14  4:29 PM  Result Value Ref Range   Sodium 141 137 - 147 mEq/L   Potassium 4.0 3.7 - 5.3 mEq/L   Chloride 102 96 - 112 mEq/L   CO2 26 19 - 32 mEq/L   Glucose, Bld 81 70 - 99 mg/dL   BUN 9 6 - 23 mg/dL   Creatinine, Ser 0.96 0.50 - 1.35 mg/dL   Calcium 9.5 8.4 - 10.5 mg/dL   GFR calc non Af Amer >90 >90 mL/min   GFR calc Af Amer >90 >90 mL/min    Comment: (NOTE) The eGFR has been calculated using the CKD EPI  equation. This calculation has not been validated in all clinical situations. eGFR's persistently <90 mL/min signify possible Chronic Kidney Disease.    Anion gap 13 5 - 15    Dg Finger Thumb Left  05/31/2014   CLINICAL DATA:  Left thumb laceration.  EXAM: LEFT THUMB 2+V  COMPARISON:  None.  FINDINGS: Moderate displaced comminuted fracture involving the distal tuft of the first distal phalanx is noted with associated soft tissue laceration. No radiopaque foreign body is noted. Joint spaces are intact.  IMPRESSION: Moderately displaced comminuted fracture involving the distal tuft of the first distal phalanx with associated soft tissue laceration.   Electronically Signed   By: Sabino Dick M.D.   On: 05/31/2014 15:59    Review of Systems  All other systems reviewed and are negative.  Blood pressure 146/94, pulse 84, temperature 98.6 F (37 C), resp. rate 18, SpO2 98 %. Physical Exam  Constitutional: He is oriented to person, place, and time. He appears well-developed and  well-nourished.  HENT:  Head: Normocephalic and atraumatic.  Cardiovascular: Normal rate.   Respiratory: Effort normal.  Musculoskeletal:       Left hand: He exhibits bony tenderness, deformity and laceration.  Grade 1 open left thumb distal phalanx fracture with complex dorsal wound  Neurological: He is alert and oriented to person, place, and time.  Skin: Skin is warm.  Psychiatric: He has a normal mood and affect. His behavior is normal. Judgment and thought content normal.    Assessment/Plan: As above  Plan explore and repair as needed  Kenneth Andersen A 05/31/2014, 5:51 PM

## 2014-05-31 NOTE — Op Note (Signed)
See note [phone]

## 2014-06-01 ENCOUNTER — Encounter (HOSPITAL_COMMUNITY): Payer: Self-pay | Admitting: Orthopedic Surgery

## 2014-06-01 NOTE — Op Note (Signed)
Kenneth Andersen:  Kenneth Andersen, Kenneth Andersen                 ACCOUNT NO.:  [id number]  MEDICAL RECORD NO.:  [id number]  LOCATION:  [facility]                         FACILITY:  [facility]  PHYSICIAN:  Artist PaisMatthew A. Phaedra Colgate, M.D.DATE OF BIRTH:  February 17, 1956  DATE OF PROCEDURE:  05/31/2014 DATE OF DISCHARGE:                              OPERATIVE REPORT   PREOPERATIVE DIAGNOSIS:  Left thumb open distal phalangeal fracture with nail bed laceration, complex wound.  POSTOPERATIVE DIAGNOSIS:  Left thumb open distal phalangeal fracture with nail bed laceration, complex wound.  PROCEDURES:  I and D above with open treatment of distal phalangeal fracture with 0.035 K-wire and nail bed repair.  SURGEON:  Artist PaisMatthew A. Mina MarbleWeingold, M.D.  ASSISTANT:  None.  ANESTHESIA:  General.  No complications.  No drains.  DESCRIPTION OF PROCEDURE:  The patient was taken to the operating suite. After induction of adequate general anesthetic, left upper extremity was prepped and draped in usual sterile fashion.  An Esmarch was used to exsanguinate the limb.  Tourniquet was then inflated to 250 mmHg.  At this point in time, complex open injury to the left thumb was approached surgically.  There was a complete avulsion of the nail plate from under the nail fold and a complex laceration with the intersection between the germinal and sterile matrices.  There was blood and clot in the fracture site.  We opened up the fracture site.  We irrigated with a liter of normal saline and removed all clot.  We then took an 0.035 K-wire and drove it from proximal to distal out to the distal fragment until was flushed with the bone.  We then realigned the fracture and drove the wire from distal to proximal under direct and fluoroscopic guidance into the base of the proximal fragment to reduce the fracture, this was cut outside the skin and bent upon itself.  We then repaired the nail bed with 6-0 chromic and the skin with 4-0 Rapide.  Once this was done,  the nail plate was placed back under the eponychial fold and sutured with 4- 0 Vicryl Rapide.  Distally, intraoperative fluoroscopy revealed adequate reduction of the AP, lateral and oblique view.  The patient was then dressed with Xeroform, 4x4s, and Coban wrap. The patient tolerated the procedure well and went to the recovery room in stable fashion.     Artist PaisMatthew A. Mina MarbleWeingold, M.D.     MAW/MEDQ  D:  05/31/2014  T:  06/01/2014  Job:  [id number]

## 2015-01-11 ENCOUNTER — Encounter (HOSPITAL_COMMUNITY): Payer: Self-pay | Admitting: Emergency Medicine

## 2015-01-11 ENCOUNTER — Emergency Department (HOSPITAL_COMMUNITY)
Admission: EM | Admit: 2015-01-11 | Discharge: 2015-01-11 | Disposition: A | Discharge status: Home / Self Care | Payer: Self-pay | Attending: Emergency Medicine | Admitting: Emergency Medicine

## 2015-01-11 ENCOUNTER — Emergency Department (HOSPITAL_COMMUNITY): Payer: Self-pay

## 2015-01-11 DIAGNOSIS — Z791 Long term (current) use of non-steroidal anti-inflammatories (NSAID): Secondary | ICD-10-CM | POA: Insufficient documentation

## 2015-01-11 DIAGNOSIS — Z79899 Other long term (current) drug therapy: Secondary | ICD-10-CM | POA: Insufficient documentation

## 2015-01-11 DIAGNOSIS — Z7982 Long term (current) use of aspirin: Secondary | ICD-10-CM | POA: Insufficient documentation

## 2015-01-11 DIAGNOSIS — R1010 Upper abdominal pain, unspecified: Secondary | ICD-10-CM | POA: Insufficient documentation

## 2015-01-11 DIAGNOSIS — Z720 Tobacco use: Secondary | ICD-10-CM | POA: Insufficient documentation

## 2015-01-11 DIAGNOSIS — R112 Nausea with vomiting, unspecified: Secondary | ICD-10-CM | POA: Insufficient documentation

## 2015-01-11 DIAGNOSIS — R630 Anorexia: Secondary | ICD-10-CM | POA: Insufficient documentation

## 2015-01-11 DIAGNOSIS — R1033 Periumbilical pain: Secondary | ICD-10-CM | POA: Insufficient documentation

## 2015-01-11 DIAGNOSIS — I10 Essential (primary) hypertension: Secondary | ICD-10-CM | POA: Insufficient documentation

## 2015-01-11 LAB — COMPREHENSIVE METABOLIC PANEL
ALK PHOS: 70 U/L (ref 38–126)
ALT: 50 U/L (ref 17–63)
ANION GAP: 5 (ref 5–15)
AST: 41 U/L (ref 15–41)
Albumin: 3.7 g/dL (ref 3.5–5.0)
BUN: 8 mg/dL (ref 6–20)
CHLORIDE: 106 mmol/L (ref 101–111)
CO2: 30 mmol/L (ref 22–32)
Calcium: 9.1 mg/dL (ref 8.9–10.3)
Creatinine, Ser: 1.01 mg/dL (ref 0.61–1.24)
GFR calc Af Amer: >60 mL/min (ref >60)
GFR calc non Af Amer: >60 mL/min (ref >60)
Glucose, Bld: 92 mg/dL (ref 65–99)
POTASSIUM: 4.2 mmol/L (ref 3.5–5.1)
SODIUM: 141 mmol/L (ref 135–145)
TOTAL PROTEIN: 7.1 g/dL (ref 6.5–8.1)
Total Bilirubin: 0.6 mg/dL (ref 0.3–1.2)

## 2015-01-11 LAB — CBC WITH DIFFERENTIAL/PLATELET
Basophils Absolute: 0 10*3/uL (ref 0.0–0.1)
Basophils Relative: 1 % (ref 0–1)
Eosinophils Absolute: 0.2 10*3/uL (ref 0.0–0.7)
Eosinophils Relative: 4 % (ref 0–5)
HCT: 47.3 % (ref 39.0–52.0)
Hemoglobin: 16 g/dL (ref 13.0–17.0)
LYMPHS ABS: 2.1 10*3/uL (ref 0.7–4.0)
Lymphocytes Relative: 53 % — ABNORMAL HIGH (ref 12–46)
MCH: 30.8 pg (ref 26.0–34.0)
MCHC: 33.8 g/dL (ref 30.0–36.0)
MCV: 91.1 fL (ref 78.0–100.0)
MONOS PCT: 12 % (ref 3–12)
Monocytes Absolute: 0.5 10*3/uL (ref 0.1–1.0)
NEUTROS ABS: 1.2 10*3/uL — AB (ref 1.7–7.7)
NEUTROS PCT: 30 % — AB (ref 43–77)
PLATELETS: 307 10*3/uL (ref 150–400)
RBC: 5.19 MIL/uL (ref 4.22–5.81)
RDW: 12.9 % (ref 11.5–15.5)
WBC: 4 10*3/uL (ref 4.0–10.5)

## 2015-01-11 LAB — LIPASE, BLOOD: LIPASE: 26 U/L (ref 22–51)

## 2015-01-11 MED ORDER — SODIUM CHLORIDE 0.9 % IV BOLUS (SEPSIS)
500.0000 mL | Freq: Once | INTRAVENOUS | Status: AC
  Administered 2015-01-11: 500 mL via INTRAVENOUS

## 2015-01-11 MED ORDER — ONDANSETRON HCL 4 MG/2ML IJ SOLN
4.0000 mg | Freq: Once | INTRAMUSCULAR | Status: AC
  Administered 2015-01-11: 4 mg via INTRAVENOUS
  Filled 2015-01-11: qty 2

## 2015-01-11 MED ORDER — IOHEXOL 300 MG/ML  SOLN
25.0000 mL | Freq: Once | INTRAMUSCULAR | Status: AC | PRN
Start: 2015-01-11 — End: 2015-01-11
  Administered 2015-01-11: 25 mL via ORAL

## 2015-01-11 MED ORDER — IOHEXOL 300 MG/ML  SOLN
100.0000 mL | Freq: Once | INTRAMUSCULAR | Status: AC | PRN
  Administered 2015-01-11: 80 mL via INTRAVENOUS

## 2015-01-11 MED ORDER — OMEPRAZOLE 20 MG PO CPDR: 20.0000 mg | DELAYED_RELEASE_CAPSULE | Freq: Every day | ORAL | Status: DC

## 2015-01-11 MED ORDER — PROMETHAZINE HCL 25 MG PO TABS: 25.0000 mg | ORAL_TABLET | Freq: Four times a day (QID) | ORAL | Status: DC | PRN

## 2015-01-11 NOTE — ED Notes (Signed)
Pt requesting food

## 2015-01-11 NOTE — ED Notes (Signed)
Pt back from CT pt hooked back up to the monitor with BP cuff and pulse ox

## 2015-01-11 NOTE — ED Notes (Signed)
Patient transported to CT 

## 2015-01-11 NOTE — ED Provider Notes (Signed)
CSN: [id number]     Arrival date & time 01/11/15  1045 History   First MD Initiated Contact with Patient 01/11/15 1053     Chief Complaint  Patient presents with  . Abdominal Pain     (Consider location/radiation/quality/duration/timing/severity/associated sxs/prior Treatment) Patient is a 59 y.o. male presenting with abdominal pain. The history is provided by the patient.  Abdominal Pain Associated symptoms: nausea and vomiting   Associated symptoms: no chest pain, no diarrhea and no shortness of breath   patient has had mid abdominal pain for the last few days. Worse after eating. Worse with alcohol or spicy food. He has had some green stool. No fevers. No chills. He states that he has lost around 5 pounds with the pain. No blood in the stool. He states that he drinks 2 twelve packs of beer on the weekends, but does not drink during the week.   Past Medical History  Diagnosis Date  . Hypertension    Past Surgical History  Procedure Laterality Date  . Tendon exploration Left 05/31/2014    Procedure: ORIF LEFT THUMB WITH NAIL BED REPAIR;  Surgeon: Dairl PonderMatthew Weingold, MD;  Location: [facility] OR;  Service: Orthopedics;  Laterality: Left;   History reviewed. No pertinent family history. History  Substance Use Topics  . Smoking status: Current Every Day Smoker  . Smokeless tobacco: Never Used  . Alcohol Use: Yes    Review of Systems  Constitutional: Positive for appetite change and unexpected weight change. Negative for activity change.  Eyes: Negative for pain.  Respiratory: Negative for chest tightness and shortness of breath.   Cardiovascular: Negative for chest pain and leg swelling.  Gastrointestinal: Positive for nausea, vomiting and abdominal pain. Negative for diarrhea and blood in stool.  Genitourinary: Negative for flank pain.  Musculoskeletal: Negative for back pain and neck stiffness.  Skin: Negative for rash.  Neurological: Negative for weakness, numbness and headaches.   Psychiatric/Behavioral: Negative for behavioral problems.      Allergies  Review of patient's allergies indicates no known allergies.  Home Medications   Prior to Admission medications   Medication Sig Start Date End Date Taking? Authorizing Provider  aspirin 325 MG tablet Take 325 mg by mouth daily.   Yes Historical Provider, MD  ibuprofen (ADVIL,MOTRIN) 200 MG tablet Take 400 mg by mouth every 6 (six) hours as needed for mild pain.   Yes Historical Provider, MD  naproxen (NAPROSYN) 500 MG tablet Take 1 tablet (500 mg total) by mouth 2 (two) times daily with a meal. 05/22/14  Yes Arthor CaptainAbigail Harris, PA-C  omeprazole (PRILOSEC) 20 MG capsule Take 1 capsule (20 mg total) by mouth daily. 01/11/15   Benjiman CoreNathan Trendon Zaring, MD  promethazine (PHENERGAN) 25 MG tablet Take 1 tablet (25 mg total) by mouth every 6 (six) hours as needed for nausea. 01/11/15   Benjiman CoreNathan Giara Mcgaughey, MD   BP 124/84 mmHg  Pulse 69  Temp(Src) 97.7 F (36.5 C) (Oral)  Resp 16  SpO2 100% Physical Exam  Constitutional: He is oriented to person, place, and time. He appears well-developed and well-nourished.  HENT:  Head: Normocephalic and atraumatic.  Eyes: Pupils are equal, round, and reactive to light.  Neck: Normal range of motion. Neck supple.  Cardiovascular: Normal rate, regular rhythm and normal heart sounds.   No murmur heard. Pulmonary/Chest: Effort normal and breath sounds normal.  Abdominal: Soft. Bowel sounds are normal. He exhibits no distension and no mass. There is tenderness. There is no rebound and no guarding.  Periumbilical  tenderness without rebound or guarding.   Musculoskeletal: Normal range of motion. He exhibits no edema.  Neurological: He is alert and oriented to person, place, and time. No cranial nerve deficit.  Skin: Skin is warm and dry.  Nursing note and vitals reviewed.   ED Course  Procedures (including critical care time) Labs Review Labs Reviewed  CBC WITH DIFFERENTIAL/PLATELET - Abnormal;  Notable for the following:    Neutrophils Relative % 30 (*)    Neutro Abs 1.2 (*)    Lymphocytes Relative 53 (*)    All other components within normal limits  COMPREHENSIVE METABOLIC PANEL  LIPASE, BLOOD  ETHANOL    Imaging Review Ct Abdomen Pelvis W Contrast  01/11/2015   CLINICAL DATA:  59 year old male with periumbilical pain for 3 days with nausea vomiting and diarrhea. Initial encounter.  EXAM: CT ABDOMEN AND PELVIS WITH CONTRAST  TECHNIQUE: Multidetector CT imaging of the abdomen and pelvis was performed using the standard protocol following bolus administration of intravenous contrast.  CONTRAST:  80mL OMNIPAQUE IOHEXOL 300 MG/ML  SOLN  COMPARISON:  Lumbar radiographs 09/01/2007.  FINDINGS: Negative lung bases; minor lower lobe atelectasis or scarring. No pericardial or pleural effusion.  Thoracolumbar scoliosis. Superimposed disc and endplate degeneration. Right hip joint degeneration. No acute osseous abnormality identified.  Paucity of intra-abdominal fat. No definite pelvic free fluid. Negative rectum and bladder.  Mildly redundant sigmoid colon with retained gas and low-density stool. Oral contrast has reached the splenic flexure. Negative left colon aside from retained stool. Negative transverse colon, right colon, and retrocecal appendix. Negative terminal ileum. No dilated small bowel. Negative stomach and duodenum.  Liver, gallbladder, spleen, pancreas and adrenal glands are within normal limits. Aortoiliac calcified atherosclerosis noted. Major arterial structures are patent. No abdominal free fluid. No lymphadenopathy identified. Bilateral renal enhancement and contrast excretion is within normal limits.  IMPRESSION: Normal retrocecal appendix. No acute or inflammatory process identified in the abdomen or pelvis.  Imaging findings of potential clinical significance:  Aortic and iliac calcified atherosclerosis.   Electronically Signed   By: Odessa Fleming M.D.   On: 01/11/2015 13:19     EKG  Interpretation None      MDM   Final diagnoses:  Pain of upper abdomen    Patient with upper abdominal and mid abdominal pain. Nausea vomiting. Could be gastritis. Lab work otherwise reassuring. CT scan reassuring to will have follow-up with gastroenterology.    Benjiman Core, MD 01/11/15 1355

## 2015-01-11 NOTE — ED Notes (Signed)
OK for pt to have something to eat per Dr Rubin PayorPickering. Pt given a Malaw[place]urkey sandwich and sprite to drink.

## 2015-01-11 NOTE — ED Notes (Signed)
MD at bedside. 

## 2015-01-11 NOTE — ED Notes (Signed)
PT has epigastric pain and nonradiating; burning pain; denies pancreatitis or ulcer hx ; drinks often and a lot especially on weekends. Had one beer this weekend but stomach hurt too bad. Having several episodes of vomiting and stools are dark green.  Had similar episode about a year ago and workup was unremarkable.

## 2015-01-11 NOTE — ED Notes (Signed)
Pt returned from xray and no distress.

## 2015-01-11 NOTE — Discharge Instructions (Signed)

## 2015-01-11 NOTE — ED Notes (Signed)
PT ambulated with baseline gait; VSS; A&Ox3; no signs of distress; respirations even and unlabored; skin warm and dry; no questions upon discharge.  

## 2016-05-17 ENCOUNTER — Emergency Department (HOSPITAL_COMMUNITY): Payer: Self-pay

## 2016-05-17 ENCOUNTER — Encounter (HOSPITAL_COMMUNITY): Payer: Self-pay

## 2016-05-17 ENCOUNTER — Emergency Department (HOSPITAL_COMMUNITY)
Admission: EM | Admit: 2016-05-17 | Discharge: 2016-05-17 | Disposition: A | Discharge status: Home / Self Care | Payer: Self-pay | Attending: Emergency Medicine | Admitting: Emergency Medicine

## 2016-05-17 DIAGNOSIS — I10 Essential (primary) hypertension: Secondary | ICD-10-CM | POA: Insufficient documentation

## 2016-05-17 DIAGNOSIS — G8929 Other chronic pain: Secondary | ICD-10-CM | POA: Insufficient documentation

## 2016-05-17 DIAGNOSIS — F17200 Nicotine dependence, unspecified, uncomplicated: Secondary | ICD-10-CM | POA: Insufficient documentation

## 2016-05-17 DIAGNOSIS — Z590 Homelessness: Secondary | ICD-10-CM | POA: Insufficient documentation

## 2016-05-17 DIAGNOSIS — R059 Cough, unspecified: Secondary | ICD-10-CM

## 2016-05-17 DIAGNOSIS — Z7982 Long term (current) use of aspirin: Secondary | ICD-10-CM | POA: Insufficient documentation

## 2016-05-17 DIAGNOSIS — R0789 Other chest pain: Secondary | ICD-10-CM | POA: Insufficient documentation

## 2016-05-17 DIAGNOSIS — M542 Cervicalgia: Secondary | ICD-10-CM | POA: Insufficient documentation

## 2016-05-17 DIAGNOSIS — Z720 Tobacco use: Secondary | ICD-10-CM

## 2016-05-17 DIAGNOSIS — R05 Cough: Secondary | ICD-10-CM | POA: Insufficient documentation

## 2016-05-17 LAB — CBC WITH DIFFERENTIAL/PLATELET
BASOS PCT: 0 %
Basophils Absolute: 0 10*3/uL (ref 0.0–0.1)
EOS ABS: 0.1 10*3/uL (ref 0.0–0.7)
EOS PCT: 2 %
HCT: 45.1 % (ref 39.0–52.0)
HEMOGLOBIN: 15.6 g/dL (ref 13.0–17.0)
Lymphocytes Relative: 25 %
Lymphs Abs: 2.4 10*3/uL (ref 0.7–4.0)
MCH: 31.8 pg (ref 26.0–34.0)
MCHC: 34.6 g/dL (ref 30.0–36.0)
MCV: 91.9 fL (ref 78.0–100.0)
Monocytes Absolute: 0.9 10*3/uL (ref 0.1–1.0)
Monocytes Relative: 9 %
NEUTROS PCT: 64 %
Neutro Abs: 6.2 10*3/uL (ref 1.7–7.7)
PLATELETS: 277 10*3/uL (ref 150–400)
RBC: 4.91 MIL/uL (ref 4.22–5.81)
RDW: 12.6 % (ref 11.5–15.5)
WBC: 9.7 10*3/uL (ref 4.0–10.5)

## 2016-05-17 LAB — BASIC METABOLIC PANEL
Anion gap: 7 (ref 5–15)
BUN: 5 mg/dL — AB (ref 6–20)
CHLORIDE: 107 mmol/L (ref 101–111)
CO2: 25 mmol/L (ref 22–32)
CREATININE: 0.78 mg/dL (ref 0.61–1.24)
Calcium: 9.1 mg/dL (ref 8.9–10.3)
Glucose, Bld: 91 mg/dL (ref 65–99)
POTASSIUM: 3.9 mmol/L (ref 3.5–5.1)
SODIUM: 139 mmol/L (ref 135–145)

## 2016-05-17 LAB — URINALYSIS, ROUTINE W REFLEX MICROSCOPIC
Bilirubin Urine: NEGATIVE
GLUCOSE, UA: NEGATIVE mg/dL
Hgb urine dipstick: NEGATIVE
KETONES UR: NEGATIVE mg/dL
LEUKOCYTES UA: NEGATIVE
NITRITE: NEGATIVE
PROTEIN: NEGATIVE mg/dL
Specific Gravity, Urine: 1.013 (ref 1.005–1.030)
pH: 7.5 (ref 5.0–8.0)

## 2016-05-17 LAB — I-STAT TROPONIN, ED: TROPONIN I, POC: 0 ng/mL (ref 0.00–0.08)

## 2016-05-17 MED ORDER — ALBUTEROL SULFATE (2.5 MG/3ML) 0.083% IN NEBU
5.0000 mg | INHALATION_SOLUTION | Freq: Once | RESPIRATORY_TRACT | Status: AC
  Administered 2016-05-17: 5 mg via RESPIRATORY_TRACT
  Filled 2016-05-17: qty 6

## 2016-05-17 MED ORDER — ALBUTEROL SULFATE HFA 108 (90 BASE) MCG/ACT IN AERS
1.0000 | INHALATION_SPRAY | RESPIRATORY_TRACT | Status: DC | PRN
  Administered 2016-05-17: 1 via RESPIRATORY_TRACT
  Filled 2016-05-17: qty 6.7

## 2016-05-17 MED ORDER — KETOROLAC TROMETHAMINE 30 MG/ML IJ SOLN
15.0000 mg | Freq: Once | INTRAMUSCULAR | Status: AC
  Administered 2016-05-17: 15 mg via INTRAVENOUS
  Filled 2016-05-17: qty 1

## 2016-05-17 NOTE — ED Triage Notes (Signed)
Pt brought in by [facility] due to having worsening neck pain that radiates down his left side of his body. Pt verbalizes that the pain is sharp in nature. Pt a&ox4.

## 2016-05-17 NOTE — ED Notes (Signed)
Reprinted UA sent to main lab
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
## 2016-05-17 NOTE — Discharge Instructions (Addendum)
Stop smoking! Continue to stay well-hydrated. Continue to alternate between Tylenol and Ibuprofen for pain or fever. Use Mucinex for cough suppression/expectoration of mucus. Use netipot and flonase to help with nasal congestion. May consider over-the-counter Benadryl or other antihistamine to decrease secretions and for watery itchy eyes. Use inhaler as directed (1-2 puffs every 4 hours as needed for chest tightness, cough, or shortness of breath). Use ice or heat to the areas of soreness/pain to help with pain. Followup with [facility] in 5-7 days for recheck of ongoing symptoms and to establish medical care. Return to emergency department for emergent changing or worsening of symptoms.

## 2016-05-17 NOTE — ED Notes (Signed)
Pt given Malawiturkey sandwich and apple sauce and ginger ale per Chestine Sporelark - RN

## 2016-05-29 ENCOUNTER — Encounter: Payer: Self-pay | Admitting: Pediatric Intensive Care

## 2016-06-04 ENCOUNTER — Encounter: Payer: Self-pay | Admitting: Pediatric Intensive Care

## 2016-06-12 ENCOUNTER — Encounter: Payer: Self-pay | Admitting: Pediatric Intensive Care

## 2016-06-12 ENCOUNTER — Emergency Department (HOSPITAL_COMMUNITY)
Admission: EM | Admit: 2016-06-12 | Discharge: 2016-06-12 | Disposition: A | Discharge status: Home / Self Care | Payer: Self-pay | Attending: Emergency Medicine | Admitting: Emergency Medicine

## 2016-06-12 ENCOUNTER — Encounter (HOSPITAL_COMMUNITY): Payer: Self-pay

## 2016-06-12 ENCOUNTER — Emergency Department (HOSPITAL_COMMUNITY): Payer: Self-pay

## 2016-06-12 DIAGNOSIS — M25512 Pain in left shoulder: Secondary | ICD-10-CM | POA: Insufficient documentation

## 2016-06-12 DIAGNOSIS — F17200 Nicotine dependence, unspecified, uncomplicated: Secondary | ICD-10-CM | POA: Insufficient documentation

## 2016-06-12 DIAGNOSIS — E1140 Type 2 diabetes mellitus with diabetic neuropathy, unspecified: Secondary | ICD-10-CM | POA: Insufficient documentation

## 2016-06-12 DIAGNOSIS — G629 Polyneuropathy, unspecified: Secondary | ICD-10-CM

## 2016-06-12 DIAGNOSIS — I10 Essential (primary) hypertension: Secondary | ICD-10-CM | POA: Insufficient documentation

## 2016-06-12 DIAGNOSIS — Z79899 Other long term (current) drug therapy: Secondary | ICD-10-CM | POA: Insufficient documentation

## 2016-06-12 DIAGNOSIS — G8929 Other chronic pain: Secondary | ICD-10-CM

## 2016-06-12 LAB — CBG MONITORING, ED: Glucose-Capillary: 68 mg/dL (ref 65–99)

## 2016-06-12 MED ORDER — KETOROLAC TROMETHAMINE 60 MG/2ML IM SOLN
60.0000 mg | Freq: Once | INTRAMUSCULAR | Status: AC
  Administered 2016-06-12: 60 mg via INTRAMUSCULAR
  Filled 2016-06-12: qty 2

## 2016-06-12 MED ORDER — LIDOCAINE 5 % EX PTCH
1.0000 | MEDICATED_PATCH | CUTANEOUS | Status: DC
  Administered 2016-06-12: 1 via TRANSDERMAL
  Filled 2016-06-12: qty 1

## 2016-06-12 MED ORDER — TRAMADOL HCL 50 MG PO TABS
50.0000 mg | ORAL_TABLET | Freq: Once | ORAL | Status: AC
  Administered 2016-06-12: 50 mg via ORAL
  Filled 2016-06-12: qty 1

## 2016-06-12 NOTE — ED Notes (Signed)
CBG is 68. °

## 2016-06-12 NOTE — ED Triage Notes (Signed)
Patient comes by EMS for left shoulder pain and bilateral leg pain.  Denies any other symptoms at this time.  Patient is A&Ox4.  Legs have poor circulation and patient is wearing supportive hose.

## 2016-06-12 NOTE — ED Provider Notes (Signed)
[facility]-EMERGENCY DEPT Provider Note   CSN: [id number] Arrival date & time: 06/12/16  0034    By signing my name below, I, Kenneth Andersen, attest that this documentation has been prepared under the direction and in the presence of Kenneth Crumble, MD . Electronically Signed: Freida Andersen, Scribe. 06/12/2016. 1:52 AM. History   Chief Complaint Chief Complaint  Patient presents with  . Arm Pain  . Leg Pain    The history is provided by the patient. No language interpreter was used.    HPI Comments:  Kenneth Andersen is a 60 y.o. male brought in by ambulance, who presents to the Emergency Department complaining of moderate left shoulder pain that radiates into the left arm.  Pt notes his pain today is chronic x 8-9 months. Pt denies recent fall/injury. He also reports decreased sensation to his bilateral feet x months as well as a discoloration to the top of his feet and polyuria. Denies PMHx of DM. He also denies SOB. No alleviating factors noted. Pt does not have a PCP and can not fill medications due to not having any money.   Past Medical History:  Diagnosis Date  . Hypertension     There are no active problems to display for this patient.   Past Surgical History:  Procedure Laterality Date  . TENDON EXPLORATION Left 05/31/2014   Procedure: ORIF LEFT THUMB WITH NAIL BED REPAIR;  Surgeon: Dairl Ponder, MD;  Location: [facility] OR;  Service: Orthopedics;  Laterality: Left;       Home Medications    Prior to Admission medications   Medication Sig Start Date End Date Taking? Authorizing Provider  albuterol (PROVENTIL HFA;VENTOLIN HFA) 108 (90 Base) MCG/ACT inhaler Inhale 1-2 puffs into the lungs every 6 (six) hours as needed for wheezing or shortness of breath.   Yes Historical Provider, MD  naproxen (NAPROSYN) 500 MG tablet Take 1 tablet (500 mg total) by mouth 2 (two) times daily with a meal. Patient not taking: Reported on 06/12/2016 05/22/14   Arthor Captain, PA-C  omeprazole  (PRILOSEC) 20 MG capsule Take 1 capsule (20 mg total) by mouth daily. Patient not taking: Reported on 06/12/2016 01/11/15   Benjiman Core, MD  promethazine (PHENERGAN) 25 MG tablet Take 1 tablet (25 mg total) by mouth every 6 (six) hours as needed for nausea. Patient not taking: Reported on 06/12/2016 01/11/15   Benjiman Core, MD    Family History History reviewed. No pertinent family history.  Social History Social History  Substance Use Topics  . Smoking status: Current Every Day Smoker  . Smokeless tobacco: Never Used  . Alcohol use Yes     Allergies   Patient has no known allergies.   Review of Systems Review of Systems 10 systems reviewed and all are negative for acute change except as noted in the HPI.  Physical Exam Updated Vital Signs BP 158/92 (BP Location: Right Arm)   Pulse 96   Temp 98.4 F (36.9 C) (Oral)   Resp 14   SpO2 96%   Physical Exam  Constitutional: He is oriented to person, place, and time. Vital signs are normal. He appears well-developed and well-nourished.  Non-toxic appearance. He does not appear ill. No distress.  HENT:  Head: Normocephalic and atraumatic.  Nose: Nose normal.  Mouth/Throat: Oropharynx is clear and moist. No oropharyngeal exudate.  Eyes: Conjunctivae and EOM are normal. Pupils are equal, round, and reactive to light. No scleral icterus.  Neck: Normal range of motion. Neck supple. No tracheal deviation,  no edema, no erythema and normal range of motion present. No thyroid mass and no thyromegaly present.  Cardiovascular: Normal rate, regular rhythm, S1 normal, S2 normal, normal heart sounds, intact distal pulses and normal pulses.  Exam reveals no gallop and no friction rub.   No murmur heard. Pulmonary/Chest: Effort normal and breath sounds normal. No respiratory distress. He has no wheezes. He has no rhonchi. He has no rales.  Abdominal: Soft. Normal appearance and bowel sounds are normal. He exhibits no distension, no ascites  and no mass. There is no hepatosplenomegaly. There is no tenderness. There is no rebound, no guarding and no CVA tenderness.  Musculoskeletal: He exhibits tenderness. He exhibits no edema.  Left anterior shoulder tenderness  Pain with passive abduction  Nml pulses and sensation distally Decreased sensation bilateral feet   Lymphadenopathy:    He has no cervical adenopathy.  Neurological: He is alert and oriented to person, place, and time. He has normal strength. No cranial nerve deficit or sensory deficit.  Skin: Skin is warm, dry and intact. No petechiae and no rash noted. He is not diaphoretic. No erythema. No pallor.  Nursing note and vitals reviewed.    ED Treatments / Results  DIAGNOSTIC STUDIES:  Oxygen Saturation is 96% on RA, normal by my interpretation.    COORDINATION OF CARE:  1:18 AM Discussed treatment plan with pt at bedside and pt agreed to plan.  Labs (all labs ordered are listed, but only abnormal results are displayed) Labs Reviewed  CBG MONITORING, ED    EKG  EKG Interpretation None       Radiology Dg Shoulder Left  Result Date: 06/12/2016 CLINICAL DATA:  Nontraumatic left shoulder pain for months EXAM: LEFT SHOULDER - 2+ VIEW COMPARISON:  None. FINDINGS: There is no evidence of fracture or dislocation. There is no evidence of arthropathy or other focal bone abnormality. Soft tissues are unremarkable. IMPRESSION: Negative. Electronically Signed   By: Ellery Plunkaniel R Mitchell M.D.   On: 06/12/2016 02:04    Procedures Procedures (including critical care time)  Medications Ordered in ED Medications  lidocaine (LIDODERM) 5 % 1 patch (1 patch Transdermal Patch Applied 06/12/16 0203)  ketorolac (TORADOL) injection 60 mg (60 mg Intramuscular Given 06/12/16 0203)  traMADol (ULTRAM) tablet 50 mg (50 mg Oral Given 06/12/16 0203)     Initial Impression / Assessment and Plan / ED Course  I have reviewed the triage vital signs and the nursing notes.  Pertinent  labs & imaging results that were available during my care of the patient were reviewed by me and considered in my medical decision making (see chart for details).  Clinical Course    Patient presents to the ED for shoulder pain and numbness in the feet.  He also has polyuria concerning for diabetes, however he sates he has been negative.  CBG in the past have been normal as well, but will recheck in the ED.  Xr of shoulder ordered given examination.  He was given toradol, tramadol, lidocaine patch and ice pack.    2:43 AM XR negative for ideology of the pain.  Advised on tylenol, ibuprofen at home as well as rest and ice packs for any swelling.  PE is concerning for rotator cuff pathology and he was advised to see a PCP for possible MRI.  He demonstrates good understanding. He appears well and in NAD.  VS remain within his normal limits and he is safe for DC.  Final Clinical Impressions(s) / ED Diagnoses  Final diagnoses:  None    New Prescriptions New Prescriptions   No medications on file     I personally performed the services described in this documentation, which was scribed in my presence. The recorded information has been reviewed and is accurate.      Kenneth CrumbleAdeleke Obdulia Steier, MD 06/12/16 [phone]

## 2016-06-14 NOTE — Congregational Nurse Program (Signed)
Congregational Nurse Program Note  Date of Encounter: 05/29/2016  Past Medical History: Past Medical History:  Diagnosis Date  . Hypertension     Encounter Details:     CNP Questionnaire - 05/29/16 1030      Patient Demographics   Is this a new or existing patient? New   Patient is considered a/an Not Applicable   Race African-American/Black     Patient Assistance   Location of Patient Assistance [facility]   Patient's financial/insurance status Self-Pay (Uninsured)   Uninsured Patient ([place] Card/Care Connects) Yes   Interventions Counseled to make appt. with provider   Patient referred to apply for the following financial assistance Medicaid;[place] Card/Care Connects   Food insecurities addressed Not Applicable   Transportation assistance No   Assistance securing medications No   Educational health offerings Navigating the healthcare system;Hypertension;Other     Encounter Details   Primary purpose of visit Chronic Illness/Condition Visit;Navigating the Healthcare System   Was an Emergency Department visit averted? Not Applicable   Does patient have a medical provider? Yes   Patient referred to Follow up with established PCP   Was a mental health screening completed? (GAINS tool) No   Does patient have dental issues? No   Does patient have vision issues? No   Does your patient have an abnormal blood pressure today? Yes   Since previous encounter, have you referred patient for abnormal blood pressure that resulted in a new diagnosis or medication change? No   Does your patient have an abnormal blood glucose today? No   Since previous encounter, have you referred patient for abnormal blood glucose that resulted in a new diagnosis or medication change? No   Was there a life-saving intervention made? No     [facility]. Has applied for [employer] and Medicaid. No history of HTN but reports history of arthritis/fibromyalgia and has some  "circulation problem" in lower extremities. Advised [facility] know that his BP was elevated in clinic. Will return to clinic for BP check.

## 2016-06-16 NOTE — Congregational Nurse Program (Signed)
Congregational Nurse Program Note  Date of Encounter: 06/04/2016  Past Medical History: Past Medical History:  Diagnosis Date  . Hypertension     Encounter Details:     CNP Questionnaire - 06/16/16 [id number]      Patient Demographics   Is this a new or existing patient? Existing   Patient is considered a/an Not Applicable   Race African-American/Black     Patient Assistance   Location of Patient Assistance [facility]   Patient's financial/insurance status Self-Pay (Uninsured)   Uninsured Patient ([employer] Research officer, trade unionCard/Care Connects) Yes   Interventions Not Applicable   Patient referred to apply for the following financial assistance [employer] Card/Care Connects;Medicaid   Food insecurities addressed Not Applicable   Transportation assistance Yes   Type of Assistance Bus Pass Given   Assistance securing medications No   Type of Assistance Other   Educational health offerings Navigating the healthcare system;Other     Encounter Details   Primary purpose of visit Chronic Illness/Condition Visit;Navigating the Healthcare System   Does patient have a medical provider? Yes   Patient referred to Follow up with established PCP   Was a mental health screening completed? (GAINS tool) No   Does patient have dental issues? No   Does patient have vision issues? No   Does your patient have an abnormal blood pressure today? Yes   Since previous encounter, have you referred patient for abnormal blood pressure that resulted in a new diagnosis or medication change? No   Does your patient have an abnormal blood glucose today? No   Since previous encounter, have you referred patient for abnormal blood glucose that resulted in a new diagnosis or medication change? No   Was there a life-saving intervention made? No     BP check. [facility] Bus passes given.

## 2016-06-22 ENCOUNTER — Encounter: Payer: Self-pay | Admitting: Pediatric Intensive Care

## 2016-07-06 ENCOUNTER — Encounter: Payer: Self-pay | Admitting: Pediatric Intensive Care

## 2016-07-10 ENCOUNTER — Encounter: Payer: Self-pay | Admitting: Pediatric Intensive Care

## 2016-07-12 NOTE — Congregational Nurse Program (Signed)
Congregational Nurse Program Note  Date of Encounter: 07/06/2016  Past Medical History: Past Medical History:  Diagnosis Date  . Hypertension     Encounter Details:     CNP Questionnaire - 07/12/16 0940      Patient Demographics   Is this a new or existing patient? Existing   Patient is considered a/an Not Applicable   Race African-American/Black     Patient Assistance   Location of Patient Assistance [facility]   Patient's financial/insurance status Self-Pay (Uninsured)   Uninsured Patient ([employer] Research officer, trade unionCard/Care Connects) Yes   Interventions Appt. has been completed   Patient referred to apply for the following financial assistance Not Applicable   Food insecurities addressed Not Applicable   Transportation assistance No   Assistance securing medications No   Educational health offerings Hypertension;Navigating the healthcare system     Encounter Details   Primary purpose of visit Navigating the Healthcare System   Was an Emergency Department visit averted? Not Applicable   Does patient have a medical provider? Yes   Patient referred to Not Applicable   Was a mental health screening completed? (GAINS tool) No   Does patient have dental issues? No   Does patient have vision issues? No   Does your patient have an abnormal blood pressure today? No   Since previous encounter, have you referred patient for abnormal blood pressure that resulted in a new diagnosis or medication change? No   Does your patient have an abnormal blood glucose today? No   Since previous encounter, have you referred patient for abnormal blood glucose that resulted in a new diagnosis or medication change? No   Was there a life-saving intervention made? No     BP check

## 2016-07-16 NOTE — Congregational Nurse Program (Signed)
Congregational Nurse Program Note  Date of Encounter: 06/22/2016  Past Medical History: Past Medical History:  Diagnosis Date  . Hypertension     Encounter Details:     CNP Questionnaire - 07/12/16 0940      Patient Demographics   Is this a new or existing patient? Existing   Patient is considered a/an Not Applicable   Race African-American/Black     Patient Assistance   Location of Patient Assistance [facility]   Patient's financial/insurance status Self-Pay (Uninsured)   Uninsured Patient ([employer] Research officer, trade unionCard/Care Connects) Yes   Interventions Appt. has been completed   Patient referred to apply for the following financial assistance Not Applicable   Food insecurities addressed Not Applicable   Transportation assistance No   Assistance securing medications No   Educational health offerings Hypertension;Navigating the healthcare system     Encounter Details   Primary purpose of visit Navigating the Healthcare System   Was an Emergency Department visit averted? Not Applicable   Does patient have a medical provider? Yes   Patient referred to Not Applicable   Was a mental health screening completed? (GAINS tool) No   Does patient have dental issues? No   Does patient have vision issues? No   Does your patient have an abnormal blood pressure today? No   Since previous encounter, have you referred patient for abnormal blood pressure that resulted in a new diagnosis or medication change? No   Does your patient have an abnormal blood glucose today? No   Since previous encounter, have you referred patient for abnormal blood glucose that resulted in a new diagnosis or medication change? No   Was there a life-saving intervention made? No     BP check

## 2016-07-16 NOTE — Congregational Nurse Program (Signed)
Congregational Nurse Program Note  Date of Encounter: 06/12/2016  Past Medical History: Past Medical History:  Diagnosis Date  . Hypertension     Encounter Details:     CNP Questionnaire - 06/22/16 1015      Patient Demographics   Is this a new or existing patient? Existing   Patient is considered a/an Not Applicable   Race African-American/Black     Patient Assistance   Location of Patient Assistance [facility]   Patient's financial/insurance status Self-Pay (Uninsured)   Uninsured Patient ([employer] Research officer, trade unionCard/Care Connects) Yes   Interventions Not Applicable   Patient referred to apply for the following financial assistance Not Applicable   Food insecurities addressed Not Applicable   Transportation assistance Yes   Type of Assistance Bus Pass Given   Assistance securing medications No   Type of Assistance Other   Educational health offerings Hypertension;Navigating the healthcare system     Encounter Details   Primary purpose of visit Chronic Illness/Condition Visit;Navigating the Healthcare System   Was an Emergency Department visit averted? Not Applicable   Does patient have a medical provider? Yes   Patient referred to Follow up with established PCP   Was a mental health screening completed? (GAINS tool) No   Does patient have dental issues? No   Does patient have vision issues? No   Does your patient have an abnormal blood pressure today? No   Since previous encounter, have you referred patient for abnormal blood pressure that resulted in a new diagnosis or medication change? No   Does your patient have an abnormal blood glucose today? No   Since previous encounter, have you referred patient for abnormal blood glucose that resulted in a new diagnosis or medication change? No   Was there a life-saving intervention made? No     [facility] given.

## 2016-07-27 ENCOUNTER — Encounter: Payer: Self-pay | Admitting: Pediatric Intensive Care

## 2016-08-16 NOTE — Congregational Nurse Program (Signed)
Congregational Nurse Program Note  Date of Encounter: 07/10/2016  Past Medical History: Past Medical History:  Diagnosis Date  . Hypertension     Encounter Details:  BP check. Client states he lost his cane and asked CN for resources.

## 2016-08-25 NOTE — Congregational Nurse Program (Signed)
Congregational Nurse Program Note  Date of Encounter: 07/27/2016  Past Medical History: Past Medical History:  Diagnosis Date  . Hypertension     Encounter Details:     CNP Questionnaire - 07/27/16 0930      Patient Demographics   Is this a new or existing patient? Existing   Patient is considered a/an Not Applicable   Race African-American/Black     Patient Assistance   Location of Patient Assistance [facility]   Patient's financial/insurance status Self-Pay (Uninsured)   Uninsured Patient ([place] Card/Care Connects) Yes   Interventions Follow-up/Education/Support provided after completed appt.   Patient referred to apply for the following financial assistance [employer]/Care Connects   Food insecurities addressed Not Applicable   Transportation assistance No   Type of Assistance Other   Assistance securing medications No   Type of Assistance Other   Educational health offerings Hypertension     Encounter Details   Primary purpose of visit Education/Health Concerns   Was an Emergency Department visit averted? Not Applicable   Does patient have a medical provider? Yes   Patient referred to Not Applicable   Was a mental health screening completed? (GAINS tool) No   Does patient have dental issues? No   Does patient have vision issues? No   Does your patient have an abnormal blood pressure today? No   Since previous encounter, have you referred patient for abnormal blood pressure that resulted in a new diagnosis or medication change? No   Does your patient have an abnormal blood glucose today? No   Since previous encounter, have you referred patient for abnormal blood glucose that resulted in a new diagnosis or medication change? No   Was there a life-saving intervention made? No     BP check

## 2016-11-14 ENCOUNTER — Other Ambulatory Visit: Payer: Self-pay | Admitting: Internal Medicine

## 2016-11-14 DIAGNOSIS — R945 Abnormal results of liver function studies: Secondary | ICD-10-CM

## 2016-11-14 DIAGNOSIS — R7989 Other specified abnormal findings of blood chemistry: Secondary | ICD-10-CM

## 2016-11-27 ENCOUNTER — Other Ambulatory Visit: Payer: Self-pay

## 2016-12-06 ENCOUNTER — Other Ambulatory Visit: Payer: Self-pay

## 2016-12-11 ENCOUNTER — Ambulatory Visit
Admission: RE | Admit: 2016-12-11 | Discharge: 2016-12-11 | Disposition: A | Discharge status: Home / Self Care | Payer: Medicaid Other | Source: Ambulatory Visit | Attending: Internal Medicine | Admitting: Internal Medicine

## 2016-12-11 DIAGNOSIS — R945 Abnormal results of liver function studies: Secondary | ICD-10-CM

## 2016-12-11 DIAGNOSIS — R7989 Other specified abnormal findings of blood chemistry: Secondary | ICD-10-CM

## 2016-12-18 ENCOUNTER — Other Ambulatory Visit: Payer: Self-pay

## 2016-12-24 ENCOUNTER — Ambulatory Visit (INDEPENDENT_AMBULATORY_CARE_PROVIDER_SITE_OTHER): Payer: Medicaid Other | Admitting: Internal Medicine

## 2016-12-24 ENCOUNTER — Encounter: Payer: Self-pay | Admitting: Internal Medicine

## 2016-12-24 ENCOUNTER — Telehealth: Payer: Self-pay | Admitting: *Deleted

## 2016-12-24 VITALS — BP 124/85 | Temp 97.8°F | Ht 69.5 in | Wt 160.0 lb

## 2016-12-24 DIAGNOSIS — B182 Chronic viral hepatitis C: Secondary | ICD-10-CM | POA: Diagnosis not present

## 2016-12-24 DIAGNOSIS — Z23 Encounter for immunization: Secondary | ICD-10-CM

## 2016-12-24 MED ORDER — GLECAPREVIR-PIBRENTASVIR 100-40 MG PO TABS: 3.0000 | ORAL_TABLET | Freq: Every day | ORAL | 1 refills | Status: DC

## 2016-12-24 NOTE — Progress Notes (Signed)
[facility]   CC: consideration for treatment for chronic hepatitis C  HPI:  +Kenneth Andersen is a 61 y.o. male who presents for initial evaluation and management of chronic hepatitis C.  Patient tested positive earlier this month. Hepatitis C-associated risk factors present are: none. Patient denies IV drug abuse, renal dialysis, sexual contact with person with liver disease, tattoos. Patient has not had other studies performed. Results: none. Patient has not had prior treatment for Hepatitis C. Patient does not have a past history of liver disease. Patient does not have a family history of liver disease. Patient does not  have associated signs or symptoms related to liver disease.  Labs reviewed and has a positive hepatitis C antibody, no viral load noted.     Records reviewed from Epic and he has had a recent ultrasound ordered by his PCP Dr. Corky DownsBakare.        Patient does not have documented immunity to Hepatitis A. Patient does not have documented immunity to Hepatitis B.    Review of Systems:  Constitutional: negative for sweats, fatigue and malaise Gastrointestinal: negative for diarrhea All other systems reviewed and are negative       Past Medical History:  Diagnosis Date  . Hypertension     Prior to Admission medications   Medication Sig Start Date End Date Taking? Authorizing Provider  albuterol (PROVENTIL HFA;VENTOLIN HFA) 108 (90 Base) MCG/ACT inhaler Inhale 1-2 puffs into the lungs every 6 (six) hours as needed for wheezing or shortness of breath.   Yes [provider]  promethazine (PHENERGAN) 25 MG tablet Take 1 tablet (25 mg total) by mouth every 6 (six) hours as needed for nausea. Patient not taking: Reported on 06/12/2016 01/11/15   Benjiman CorePickering, Nathan, MD    No Known Allergies  Social History  Substance Use Topics  . Smoking status: Current Every Day Smoker    Types: Cigarettes  . Smokeless tobacco: Never Used  . Alcohol use No      FMH: no cirrhosis, no liver cancer   Objective:  Constitutional: in no apparent distress,  Vitals:   12/24/16 1012  BP: 124/85  Temp: 97.8 F (36.6 C)   Eyes: anicteric Cardiovascular: Cor RRR Respiratory: CTA B; normal respiratory effort Gastrointestinal: Bowel sounds are normal, liver is not enlarged, spleen is not enlarged, soft, nt Musculoskeletal: no pedal edema noted Skin: negatives: no rash; no porphyria cutanea tarda Lymphatic: no cervical lymphadenopathy   Laboratory Genotype: No results found for: HCVGENOTYPE HCV viral load: No results found for: HCVQUANT Lab Results  Component Value Date   WBC 9.7 05/17/2016   HGB 15.6 05/17/2016   HCT 45.1 05/17/2016   MCV 91.9 05/17/2016   PLT 277 05/17/2016    Lab Results  Component Value Date   CREATININE 0.78 05/17/2016   BUN 5 (L) 05/17/2016   NA 139 05/17/2016   K 3.9 05/17/2016   CL 107 05/17/2016   CO2 25 05/17/2016    Lab Results  Component Value Date   ALT 50 01/11/2015   AST 41 01/11/2015   ALKPHOS 70 01/11/2015     Labs and history reviewed and show CHILD-PUGH unknown  5-6 points: Child class A 7-9 points: Child class B 10-15 points: Child class C  Lab Results  Component Value Date   BILITOT 0.6 01/11/2015   ALBUMIN 3.7 01/11/2015     Assessment: New Patient with Chronic Hepatitis C genotype unknown, untreated.  I discussed with the patient the  lab findings that confirm chronic hepatitis C as well as the natural history and progression of disease including about 30% of people who develop cirrhosis of the liver if left untreated and once cirrhosis is established there is a 2-7% risk per year of liver cancer and liver failure.  I discussed the importance of treatment and benefits in reducing the risk, even if significant liver fibrosis exists.   Plan: 1) Patient counseled extensively on limiting acetaminophen to no more than 2 grams daily, avoidance of alcohol. 2) Transmission discussed with  patient including sexual transmission, sharing razors and toothbrush.   3) Will need referral to gastroenterology if concern for cirrhosis 4) Will need referral for substance abuse counseling: No.; Further work up to include urine drug screen  No. 5) Will prescribe appropriate medication based on genotype and coverage - will order Mavyret today, has medicaid 6) Hepatitis A and B titers 7) Pneumovax vaccine today 9) Further work up to include liver staging with elastography 10) will follow up after starting medication

## 2016-12-24 NOTE — Patient Instructions (Signed)
Date 12/24/16  Dear Mr. Kenneth Andersen, As discussed in the ID Clinic, your hepatitis C therapy will include highly effective medication(s) for treatment and will vary based on the type of hepatitis C and insurance approval.  Potential medications include:          Harvoni (sofosbuvir 90mg /ledipasvir 400mg ) tablet oral daily          OR     Epclusa (sofosbuvir 400mg /velpatasvir 100mg ) tablet oral daily          OR      Mavyret (glecaprevir 100 mg/pibrentasvir 40 mg): Take 3 tablets oral daily          OR     Zepatier (elbasvir 50 mg/grazoprevir 100 mg) oral daily, +/- ribavirin              Medications are typically for 8 or 12 weeks total ---------------------------------------------------------------- Your HCV Treatment Start Date: You will be notified by our office once the medication is approved and where you can pick it up (or if mailed)   ---------------------------------------------------------------- YOUR PHARMACY CONTACT:   [facility] [address] [place], [place] [address] Phone: [phone] Hours: Monday to Friday 7:30 am to 6:00 pm   Please always contact your pharmacy at least 3-4 business days before you run out of medications to ensure your next month's medication is ready or 1 week prior to running out if you receive it by mail.  Remember, each prescription is for 28 days. ---------------------------------------------------------------- GENERAL NOTES REGARDING YOUR HEPATITIS C MEDICATION:  Some medications have the following interactions:  - Acid reducing agents such as H2 blockers (ie. Pepcid (famotidine), Zantac (ranitidine), Tagamet (cimetidine), Axid (nizatidine) and proton pump inhibitors (ie. Prilosec (omeprazole), Protonix (pantoprazole), Nexium (esomeprazole), or Aciphex (rabeprazole)). Do not take until you have discussed with a health care provider.    -Antacids that contain magnesium and/or aluminum hydroxide (ie. Milk of Magensia, Rolaids,  Gaviscon, Maalox, Mylanta, an dArthritis Pain Formula).  -Calcium carbonate (calcium supplements or antacids such as Tums, Caltrate, Os-Cal).  -St. John's wort or any products that contain St. John's wort like some herbal supplements  Please inform the office prior to starting any of these medications.  - The common side effects associated with Harvoni include:      1. Fatigue      2. Headache      3. Nausea      4. Diarrhea      5. Insomnia  Please note that this only lists the most common side effects and is NOT a comprehensive list of the potential side effects of these medications. For more information, please review the drug information sheets that come with your medication package from the pharmacy.  ---------------------------------------------------------------- GENERAL HELPFUL HINTS ON HCV THERAPY: 1. Stay well-hydrated. 2. Notify the ID Clinic of any changes in your other over-the-counter/herbal or prescription medications. 3. If you miss a dose of your medication, take the missed dose as soon as you remember. Return to your regular time/dose schedule the next day.  4.  Do not stop taking your medications without first talking with your healthcare provider. 5.  You may take Tylenol (acetaminophen), as long as the dose is less than 2000 mg (OR no more than 4 tablets of the Tylenol Extra Strengths 500mg  tablet) in 24 hours. 6.  You will see our pharmacist-specialist within the first 2 weeks of starting your medication to monitor for any possible side effects. 7.  You will have labs once during treatment, soon  after treatment completion and one final lab 6 months after treatment completion to verify the virus is out of your system.  Scharlene Gloss, [place] for [place], [place]  [phone]

## 2016-12-24 NOTE — Telephone Encounter (Signed)
Called patient to inform him of appt for ultrasound [facility] on 01/08/17 at 8:45 AM. Nothing to eat or drink after midnight. Left voice mail asking that he return the call for his ultrasound appt information. Wendall MolaJacqueline Cockerham

## 2016-12-24 NOTE — Addendum Note (Signed)
Addended by: Wendall MolaOCKERHAM, JACQUELINE A on: 12/24/2016 11:25 AM   Modules accepted: Orders

## 2016-12-25 LAB — COMPLETE METABOLIC PANEL WITH GFR
ALT: 89 U/L — ABNORMAL HIGH (ref 9–46)
AST: 56 U/L — AB (ref 10–35)
Albumin: 4.2 g/dL (ref 3.6–5.1)
Alkaline Phosphatase: 119 U/L — ABNORMAL HIGH (ref 40–115)
BUN: 23 mg/dL (ref 7–25)
CALCIUM: 9.9 mg/dL (ref 8.6–10.3)
CHLORIDE: 106 mmol/L (ref 98–110)
CO2: 21 mmol/L (ref 20–31)
Creat: 0.97 mg/dL (ref 0.70–1.33)
GFR, Est Non African American: 85 mL/min (ref >=60)
Glucose, Bld: 72 mg/dL (ref 65–99)
Potassium: 5.3 mmol/L (ref 3.5–5.3)
Sodium: 142 mmol/L (ref 135–146)
Total Bilirubin: 0.4 mg/dL (ref 0.2–1.2)
Total Protein: 7 g/dL (ref 6.1–8.1)

## 2016-12-25 LAB — HEPATITIS A ANTIBODY, TOTAL: Hep A Total Ab: REACTIVE — AB

## 2016-12-25 LAB — HIV ANTIBODY (ROUTINE TESTING W REFLEX): HIV 1&2 Ab, 4th Generation: NONREACTIVE

## 2016-12-25 LAB — HEPATITIS B SURFACE ANTIGEN: HEP B S AG: NEGATIVE

## 2016-12-25 LAB — PROTIME-INR
INR: 1
PROTHROMBIN TIME: 10.4 s (ref 9.0–11.5)

## 2016-12-25 LAB — HEPATITIS B CORE ANTIBODY, TOTAL: Hep B Core Total Ab: REACTIVE — AB

## 2016-12-25 LAB — HEPATITIS B SURFACE ANTIBODY,QUALITATIVE: HEP B S AB: NEGATIVE

## 2016-12-27 LAB — LIVER FIBROSIS, FIBROTEST-ACTITEST
ALPHA-2-MACROGLOBULIN: 320 mg/dL — AB (ref 106–279)
ALT: 88 U/L — AB (ref 9–46)
Apolipoprotein A1: 162 mg/dL (ref 94–176)
Bilirubin: 0.3 mg/dL (ref 0.2–1.2)
FIBROSIS SCORE: 0.43
GGT: 72 U/L (ref 3–85)
Haptoglobin: 131 mg/dL (ref 43–212)
Necroinflammat ACT Score: 0.56
Reference ID: 1997604

## 2016-12-28 ENCOUNTER — Telehealth: Payer: Self-pay | Admitting: *Deleted

## 2016-12-28 LAB — HCV RNA, QN PCR RFLX GENO, LIPA
HCV RNA, PCR, QN: 173000 IU/mL — ABNORMAL HIGH
HCV RNA, PCR, QN: 5.24 log IU/mL — ABNORMAL HIGH

## 2016-12-28 LAB — HEPATITIS C GENOTYPE: HCV Genotype: 2

## 2016-12-28 NOTE — Telephone Encounter (Signed)
Prior authorization initiated for ultrasound elastography. I completed the clinical review and it was sent for MD review. Will call Medicaid on Thursday 01/03/17 to see if approved. Case # [id number]. Wendall MolaJacqueline Mylan Schwarz

## 2017-01-04 NOTE — Telephone Encounter (Signed)
Medicaid has denied the ultrasound elastography. MD can do a peer to peer review if necessary. Kenneth MolaJacqueline Andersen

## 2017-01-07 ENCOUNTER — Other Ambulatory Visit: Payer: Self-pay | Admitting: Internal Medicine

## 2017-01-07 ENCOUNTER — Telehealth: Payer: Self-pay | Admitting: *Deleted

## 2017-01-07 ENCOUNTER — Telehealth: Payer: Self-pay | Admitting: Internal Medicine

## 2017-01-07 DIAGNOSIS — B182 Chronic viral hepatitis C: Secondary | ICD-10-CM

## 2017-01-07 NOTE — Telephone Encounter (Signed)
I put in an order for the elastography by itself.  Lets see if that works.  thanks

## 2017-01-07 NOTE — Telephone Encounter (Signed)
U/S with elastography NOT approved by Medicaid.  Pt's appt cancelled by Radiology Scheduling.  Left message at the only phone number available no to come for appointment tomorrow, 01/08/17.  Will route message to Dr. Luciana Axeomer and J. Jennye Moccasinockerham, CMA.

## 2017-01-07 NOTE — Telephone Encounter (Signed)
No Insurance Pre-Authorization (precert was denied with medicaid, pt needs to contact Dr's office...had to leave our phone number with person that answered phone, he said pt doesn't have phone has to give him the message when he see's him)

## 2017-01-08 ENCOUNTER — Telehealth: Payer: Self-pay | Admitting: Pharmacist

## 2017-01-08 ENCOUNTER — Other Ambulatory Visit: Payer: Self-pay | Admitting: Pharmacist

## 2017-01-08 ENCOUNTER — Ambulatory Visit (HOSPITAL_COMMUNITY): Payer: Medicaid Other

## 2017-01-08 MED FILL — MAVYRET 100-40 MG TABS: 100-40 | 28 days supply | Qty: 84 | Fill #0

## 2017-01-08 NOTE — Telephone Encounter (Signed)
Called Kenneth Andersen to counsel him on Mavyret for his Hep C. Did a medication reconciliation and added all of his medications to his profile.  No drug interactions identified.

## 2017-01-11 ENCOUNTER — Encounter: Payer: Self-pay | Admitting: Pharmacy Technician

## 2017-01-18 ENCOUNTER — Telehealth: Payer: Self-pay | Admitting: *Deleted

## 2017-01-18 NOTE — Telephone Encounter (Signed)
Initiated prior authorization for new liver elastography, CPT code 1610976705 with a modifier of 0346T. Awaiting fax stating whether this test has been approved. Kenneth Andersen

## 2017-01-23 NOTE — Telephone Encounter (Signed)
Authorization denied, fax arrived 7/18.

## 2017-01-26 ENCOUNTER — Encounter (HOSPITAL_COMMUNITY): Payer: Self-pay | Admitting: Emergency Medicine

## 2017-01-26 ENCOUNTER — Emergency Department (HOSPITAL_COMMUNITY)
Admission: EM | Admit: 2017-01-26 | Discharge: 2017-01-26 | Disposition: A | Discharge status: Home / Self Care | Payer: Medicaid Other | Attending: Emergency Medicine | Admitting: Emergency Medicine

## 2017-01-26 DIAGNOSIS — M5416 Radiculopathy, lumbar region: Secondary | ICD-10-CM | POA: Insufficient documentation

## 2017-01-26 DIAGNOSIS — F17210 Nicotine dependence, cigarettes, uncomplicated: Secondary | ICD-10-CM | POA: Diagnosis not present

## 2017-01-26 DIAGNOSIS — Z79899 Other long term (current) drug therapy: Secondary | ICD-10-CM | POA: Insufficient documentation

## 2017-01-26 DIAGNOSIS — M549 Dorsalgia, unspecified: Secondary | ICD-10-CM | POA: Diagnosis present

## 2017-01-26 DIAGNOSIS — I10 Essential (primary) hypertension: Secondary | ICD-10-CM | POA: Insufficient documentation

## 2017-01-26 DIAGNOSIS — M5410 Radiculopathy, site unspecified: Secondary | ICD-10-CM

## 2017-01-26 HISTORY — DX: Dorsalgia, unspecified: M54.9

## 2017-01-26 MED ORDER — PREDNISONE 20 MG PO TABS: ORAL_TABLET | ORAL | 0 refills | Status: DC

## 2017-01-26 MED ORDER — PREDNISONE 20 MG PO TABS
60.0000 mg | ORAL_TABLET | Freq: Once | ORAL | Status: AC
  Administered 2017-01-26: 60 mg via ORAL
  Filled 2017-01-26: qty 3

## 2017-01-26 NOTE — ED Triage Notes (Signed)
Pt c/o left sided low back pain that radiates down left leg x 2 days. Pt denies injury. Pt has history of back pain and tried naproxen, flexeril and gabapentin without relief. Last doses was last night.

## 2017-01-26 NOTE — ED Provider Notes (Signed)
[facility] Provider Note   CSN: [id number] Arrival date & time: 01/26/17  1308     History   Chief Complaint Chief Complaint  Patient presents with  . Back Pain    HPI Kenneth Andersen is a 61 y.o. male.  HPI   61 year old male with history of hypertension, recurrent back pain presenting complaining of back pain. Patient report for the past 2-3 days he has had pain to his left low back radiates to the back of his left thigh. Pain is sharp and shooting, worsening with ambulation with bending forward. Pain feels similar to prior pain that he has since 2009 however more intense. He was seen by his PCP over 2 weeks ago for similar back pain. He was prescribed muscle relaxants, gabapentin, naproxen which she has been taking recently but states it provided no adequate relief. Last use medication was last night. He denies any recent injury, denies bowel bladder incontinence or saddle anesthesia. 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 Does have history of hepatitis C that he is currently taking medication for. He did told that he has degenerative disc in his spine which may contribute to his recurrent back pain. Has not noticed any skin rash, dysuria or hematuria. Denies any focal numbness or weakness. He is able to ambulate with a cane.  Past Medical History:  Diagnosis Date  . Back pain   . Hypertension     Patient Active Problem List   Diagnosis Date Noted  . Chronic hepatitis C without hepatic coma (HCC) 12/24/2016    Past Surgical History:  Procedure Laterality Date  . TENDON EXPLORATION Left 05/31/2014   Procedure: ORIF LEFT THUMB WITH NAIL BED REPAIR;  Surgeon: Dairl PonderMatthew Weingold, MD;  Location: [facility] OR;  Service: Orthopedics;  Laterality: Left;       Home Medications    Prior to Admission medications   Medication Sig Start Date End Date Taking? Authorizing Provider  albuterol (PROVENTIL HFA;VENTOLIN HFA) 108 (90 Base)  MCG/ACT inhaler Inhale 1-2 puffs into the lungs every 6 (six) hours as needed for wheezing or shortness of breath.    [provider]  cyclobenzaprine (FLEXERIL) 5 MG tablet Take 5 mg by mouth 3 (three) times daily as needed for muscle spasms.    [provider]  gabapentin (NEURONTIN) 300 MG capsule Take 300 mg by mouth 3 (three) times daily.    [provider]  Glecaprevir-Pibrentasvir (MAVYRET) 100-40 MG TABS Take 3 tablets by mouth daily. 12/24/16   Gardiner Barefootomer, Robert W, MD  lisinopril-hydrochlorothiazide (PRINZIDE,ZESTORETIC) 20-12.5 MG tablet Take 2 tablets by mouth daily.    [provider]  loratadine (CLARITIN) 10 MG tablet Take 10 mg by mouth daily.    [provider]  naproxen (NAPROSYN) 500 MG tablet Take 500 mg by mouth 2 (two) times daily as needed.    [provider]  omeprazole (PRILOSEC) 20 MG capsule Take 20 mg by mouth daily.    [provider]  QUEtiapine (SEROQUEL) 25 MG tablet Take 25 mg by mouth at bedtime.    [provider]  sertraline (ZOLOFT) 50 MG tablet Take 50 mg by mouth daily.    [provider]  tiotropium (SPIRIVA) 18 MCG inhalation capsule Place 18 mcg into inhaler and inhale daily.    [provider]    Family History No family history on file.  Social History Social History  Substance Use Topics  . Smoking status:  Current Every Day Smoker    Types: Cigarettes  . Smokeless tobacco: Never Used  . Alcohol use No     Allergies   Patient has no known allergies.   Review of Systems Review of Systems  Constitutional: Negative for fever.  Musculoskeletal: Positive for back pain.  Neurological: Negative for numbness.  All other systems reviewed and are negative.    Physical Exam Updated Vital Signs BP (!) 130/91   Pulse (!) 102   Temp 98.1 F (36.7 C)   Resp 18   Ht 5\' 11"  (1.803 m)   Wt 75.8 kg (167 lb)   SpO2 99%   BMI 23.29 kg/m   Physical Exam    Constitutional: He appears well-developed and well-nourished. No distress.  Elderly male laying in bed in no acute discomfort.  HENT:  Head: Atraumatic.  Eyes: Conjunctivae are normal.  Neck: Neck supple.  Cardiovascular: Normal rate, regular rhythm and intact distal pulses.   Pulmonary/Chest: Breath sounds normal.  Abdominal: Soft. He exhibits no distension. There is no tenderness.  Musculoskeletal: He exhibits tenderness (Tenderness along lumbar spine on palpation without any overlying skin changes crepitus or step-off. Tenderness to left lumbosacral region with negative straight leg raise.). He exhibits no edema.  Neurological: He is alert.  Patellar deep tendon reflex intact bilaterally. Intact distal pedal pulses.  Skin: No rash noted.  Psychiatric: He has a normal mood and affect.  Nursing note and vitals reviewed.    ED Treatments / Results  Labs (all labs ordered are listed, but only abnormal results are displayed) Labs Reviewed - No data to display  EKG  EKG Interpretation None       Radiology No results found.  Procedures Procedures (including critical care time)  Medications Ordered in ED Medications - No data to display   Initial Impression / Assessment and Plan / ED Course  I have reviewed the triage vital signs and the nursing notes.  Pertinent labs & imaging results that were available during my care of the patient were reviewed by me and considered in my medical decision making (see chart for details).     BP 122/86   Pulse 94   Temp 98.1 F (36.7 C)   Resp 16   Ht 5\' 11"  (1.803 m)   Wt 75.8 kg (167 lb)   SpO2 97%   BMI 23.29 kg/m    Final Clinical Impressions(s) / ED Diagnoses   Final diagnoses:  Radicular pain    New Prescriptions New Prescriptions   PREDNISONE (DELTASONE) 20 MG TABLET    2 tabs po daily x 4 days   1:58 PM Pt here with acute on chronic back pain with radicular pain.  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  I have low suspicion for infection causing back pain.  No other red flags.  He's able to ambulate.  He is currently taking naproxen, gabapentin and flexeril.  Plan to add prednisone as treatment.  Ortho referral given as needed.  Return precaution given.     Fayrene Helper, PA-C 01/26/17 1427    Arby Barrette, MD 01/26/17 [phone]

## 2017-01-30 ENCOUNTER — Emergency Department (HOSPITAL_COMMUNITY)
Admission: EM | Admit: 2017-01-30 | Discharge: 2017-01-30 | Disposition: A | Discharge status: Home / Self Care | Payer: Medicaid Other | Attending: Emergency Medicine | Admitting: Emergency Medicine

## 2017-01-30 ENCOUNTER — Ambulatory Visit (INDEPENDENT_AMBULATORY_CARE_PROVIDER_SITE_OTHER): Payer: Medicaid Other | Admitting: Pharmacist

## 2017-01-30 ENCOUNTER — Emergency Department (HOSPITAL_COMMUNITY): Payer: Medicaid Other

## 2017-01-30 ENCOUNTER — Encounter (HOSPITAL_COMMUNITY): Payer: Self-pay | Admitting: *Deleted

## 2017-01-30 DIAGNOSIS — R52 Pain, unspecified: Secondary | ICD-10-CM | POA: Diagnosis not present

## 2017-01-30 DIAGNOSIS — F17210 Nicotine dependence, cigarettes, uncomplicated: Secondary | ICD-10-CM | POA: Insufficient documentation

## 2017-01-30 DIAGNOSIS — Y9389 Activity, other specified: Secondary | ICD-10-CM | POA: Diagnosis not present

## 2017-01-30 DIAGNOSIS — I10 Essential (primary) hypertension: Secondary | ICD-10-CM | POA: Insufficient documentation

## 2017-01-30 DIAGNOSIS — B182 Chronic viral hepatitis C: Secondary | ICD-10-CM | POA: Diagnosis present

## 2017-01-30 DIAGNOSIS — M545 Low back pain: Secondary | ICD-10-CM | POA: Insufficient documentation

## 2017-01-30 DIAGNOSIS — R51 Headache: Secondary | ICD-10-CM | POA: Insufficient documentation

## 2017-01-30 DIAGNOSIS — Y92410 Unspecified street and highway as the place of occurrence of the external cause: Secondary | ICD-10-CM | POA: Insufficient documentation

## 2017-01-30 DIAGNOSIS — S0990XA Unspecified injury of head, initial encounter: Secondary | ICD-10-CM | POA: Diagnosis present

## 2017-01-30 DIAGNOSIS — Z79899 Other long term (current) drug therapy: Secondary | ICD-10-CM | POA: Insufficient documentation

## 2017-01-30 DIAGNOSIS — Y999 Unspecified external cause status: Secondary | ICD-10-CM | POA: Insufficient documentation

## 2017-01-30 MED ORDER — IBUPROFEN 600 MG PO TABS: 600.0000 mg | ORAL_TABLET | Freq: Four times a day (QID) | ORAL | 0 refills | Status: DC | PRN

## 2017-01-30 MED ORDER — IBUPROFEN 600 MG PO TABS
600.0000 mg | ORAL_TABLET | Freq: Four times a day (QID) | ORAL | 0 refills | Status: DC | PRN
Start: 2017-01-30 — End: 2017-11-07

## 2017-01-30 MED ORDER — METHOCARBAMOL 500 MG PO TABS: 500.0000 mg | ORAL_TABLET | Freq: Two times a day (BID) | ORAL | 0 refills | Status: DC

## 2017-01-30 MED ORDER — IBUPROFEN 200 MG PO TABS
600.0000 mg | ORAL_TABLET | Freq: Once | ORAL | Status: AC
  Administered 2017-01-30: 600 mg via ORAL

## 2017-01-30 MED FILL — MAVYRET 100-40 MG TABS: 100-40 | 28 days supply | Qty: 84 | Fill #1

## 2017-01-30 MED FILL — IBUPROFEN 600 MG TABLET: 600 | 7 days supply | Qty: 30 | Fill #0

## 2017-01-30 MED FILL — METHOCARBAMOL 500 MG TABLET: 500 | 10 days supply | Qty: 20 | Fill #0
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
## 2017-01-30 NOTE — Discharge Instructions (Signed)
Take your medications as prescribed. I also recommend applying ice and/or heat to affected area for 15-20 minutes 3-4 times daily for additional pain relief. Refrain from doing any heavy lifting, squatting or repetitive movements that exacerbate your symptoms. °Follow-up with your primary care provider in the next week if her symptoms have not improved.  °Please return to the Emergency Department if symptoms worsen or new onset of fever, numbness, tingling, groin anesthesia, loss of bowel or bladder, weakness. °
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
## 2017-01-30 NOTE — ED Notes (Signed)
Pt is in stable condition upon d/c and ambulates from ED. Pt given a sandwich prior to dc.
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
## 2017-02-01 LAB — HEPATITIS C RNA QUANTITATIVE
HCV QUANT LOG: NOT DETECTED {Log_IU}/mL
HCV QUANT: NOT DETECTED [IU]/mL

## 2017-03-20 ENCOUNTER — Ambulatory Visit: Payer: Medicaid Other

## 2017-06-19 ENCOUNTER — Ambulatory Visit: Payer: Medicaid Other

## 2017-07-22 ENCOUNTER — Telehealth: Payer: Self-pay | Admitting: Pharmacist

## 2017-07-22 ENCOUNTER — Ambulatory Visit: Payer: Medicaid Other

## 2017-07-22 NOTE — Telephone Encounter (Signed)
Kenneth Andersen missed his appointment with us this AM for his Hep C cure visit.  He just needs labs to see if he is cured.  No answer, no option to leave a voicemail.  Will try again later.

## 2017-10-03 ENCOUNTER — Ambulatory Visit (INDEPENDENT_AMBULATORY_CARE_PROVIDER_SITE_OTHER): Payer: Medicaid Other | Admitting: Physician Assistant

## 2017-10-08 IMAGING — US US ABDOMEN LIMITED
1 series · 14 of 25 positions shown · non-contrast
Comparison: 01/11/2015 CT.

CLINICAL DATA: 61-year-old hypertensive male with abnormal LFTs.
Hepatitis-C. Initial encounter.

EXAM:
ULTRASOUND ABDOMEN LIMITED RIGHT UPPER QUADRANT

[Series 1: us abdomen limited · 0.15mm/px · 14 of 51 slices shown]
[im 1/51]
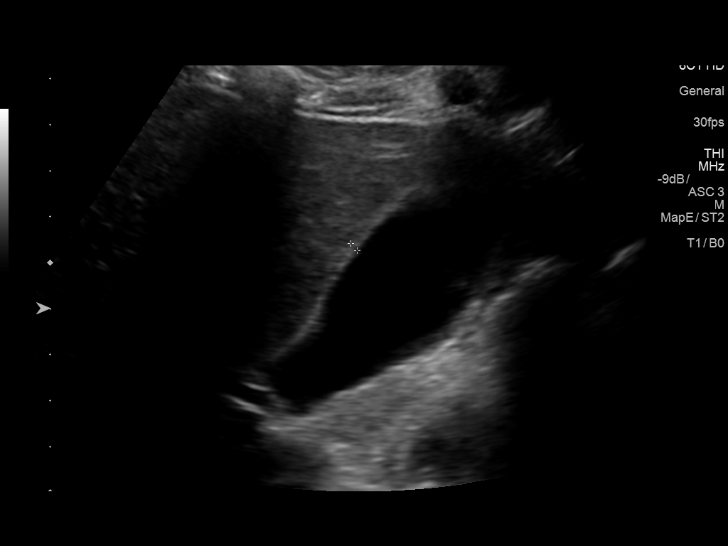
[im 5/51]
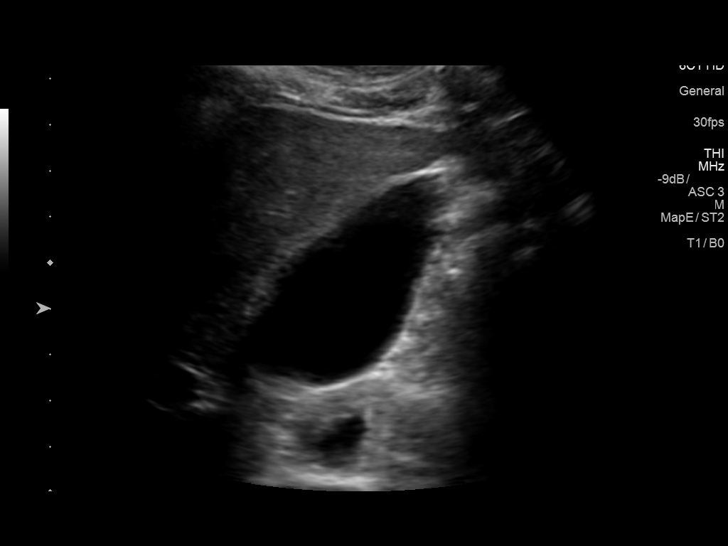
[im 9/51]
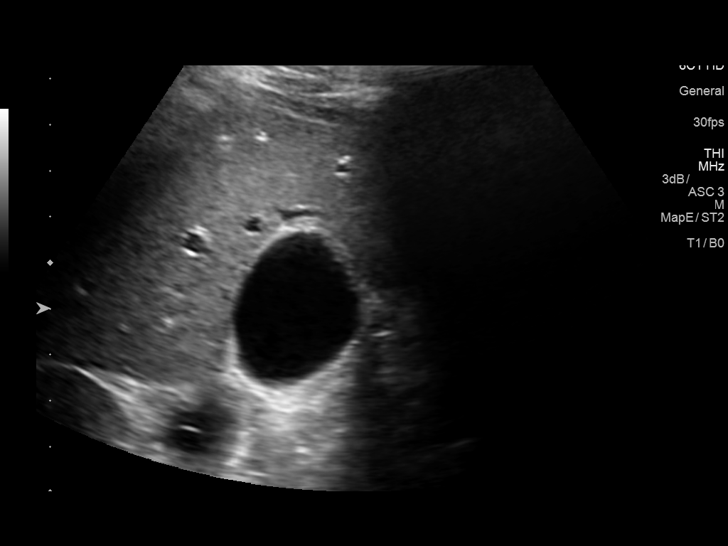
[im 13/51]
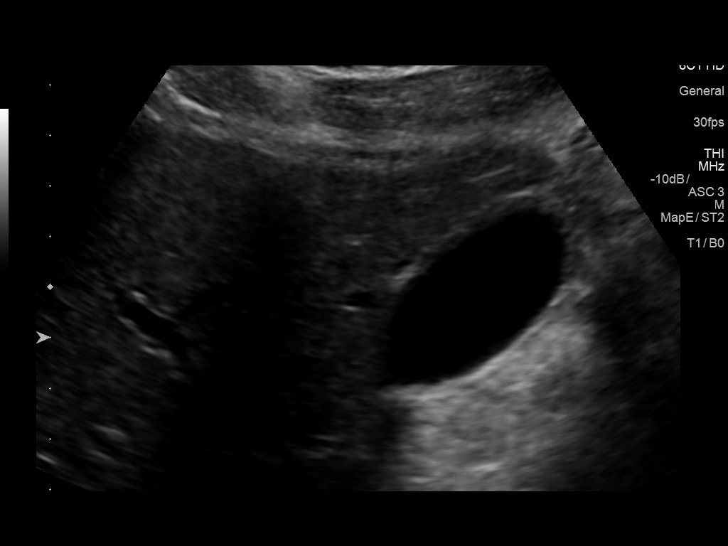
[im 17/51]
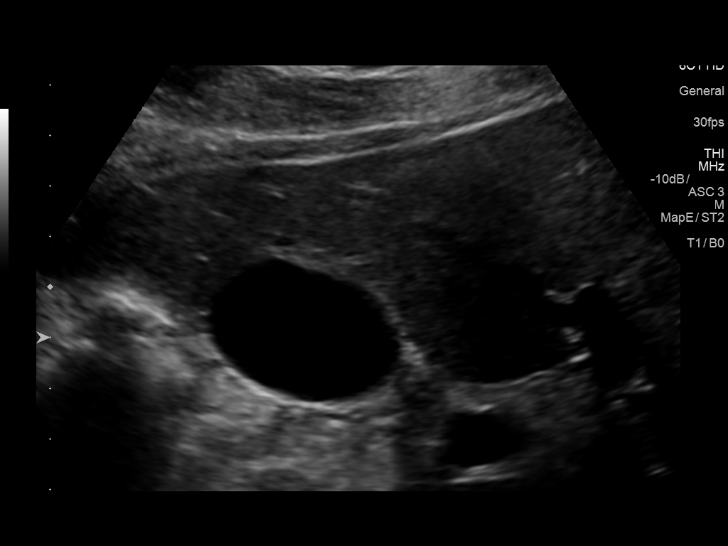
[im 19/51]
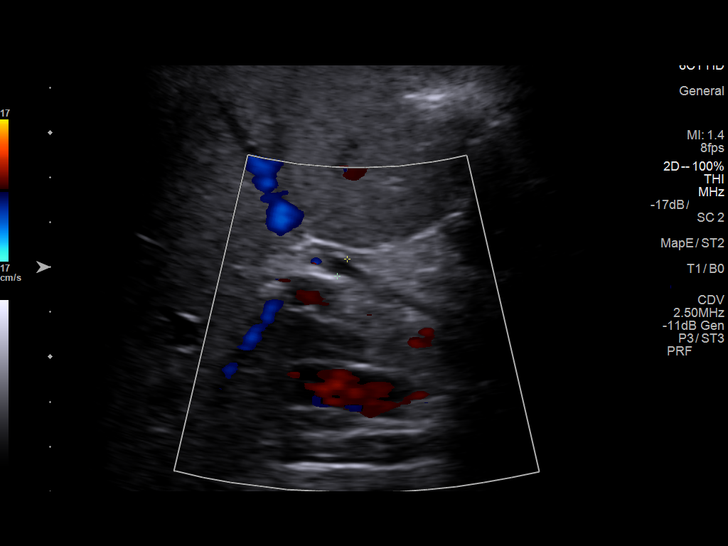
[im 23/51]
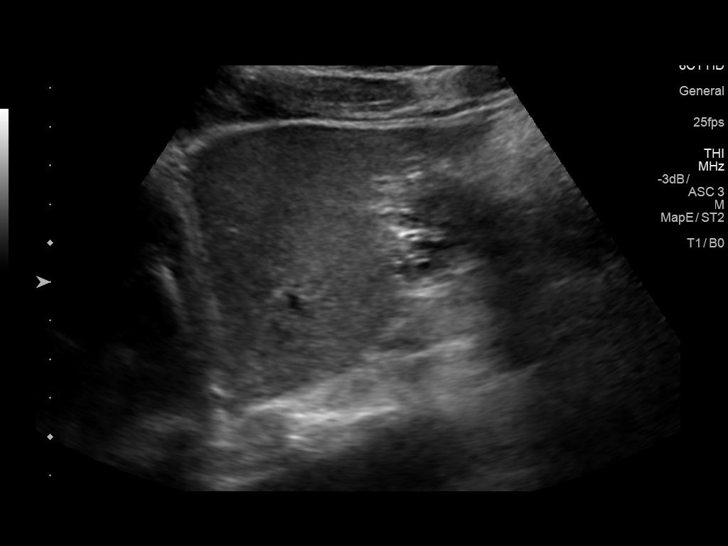
[im 28/51]
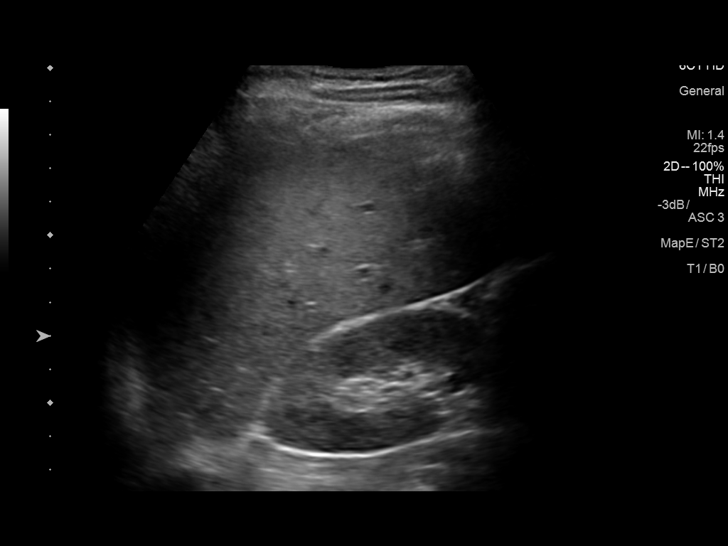
[im 32/51]
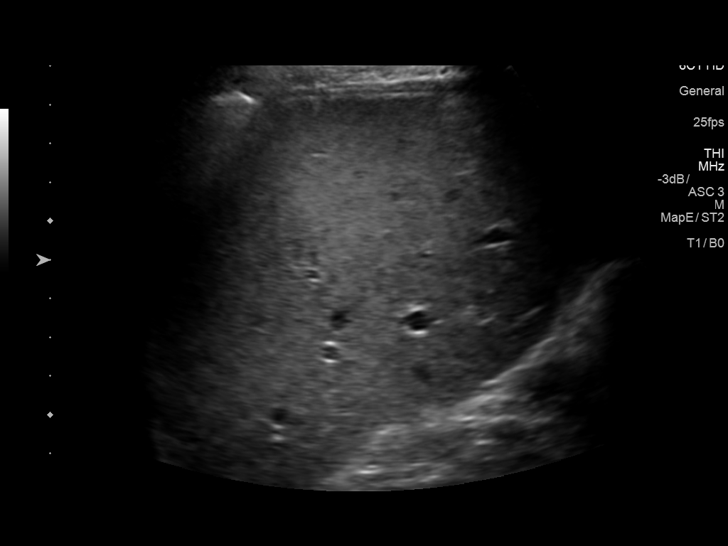
[im 34/51]
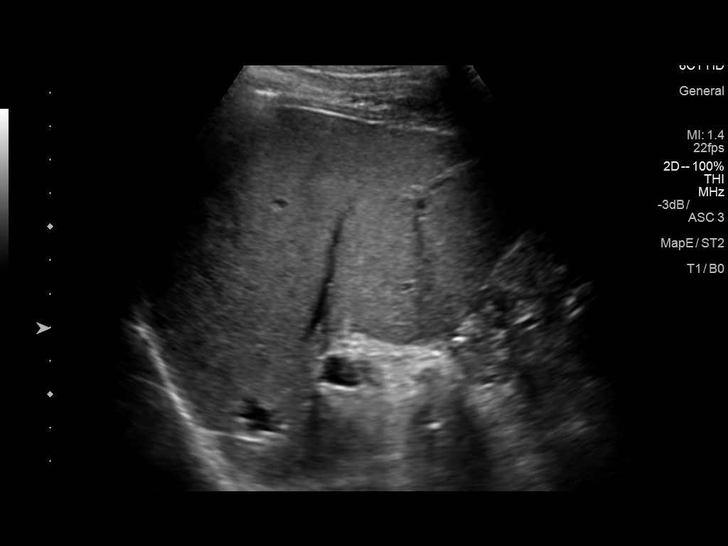
[im 38/51]
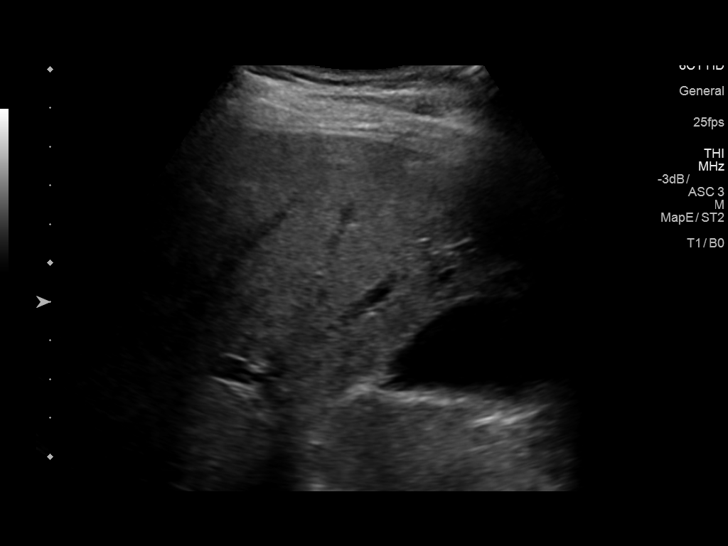
[im 42/51]
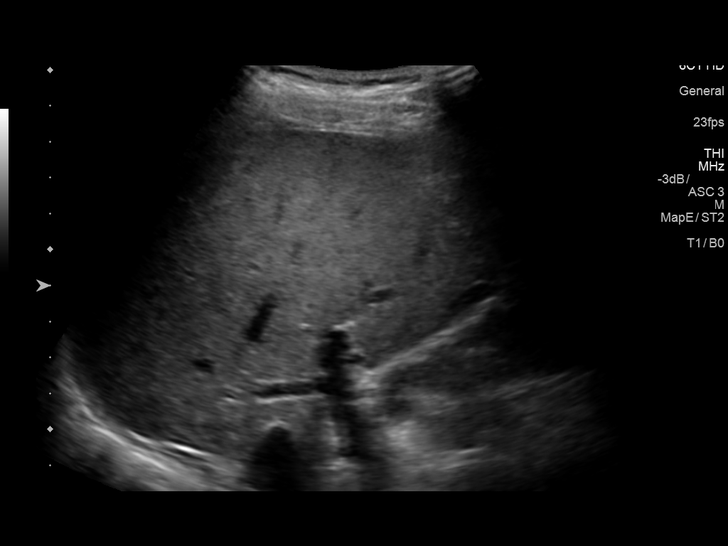
[im 46/51]
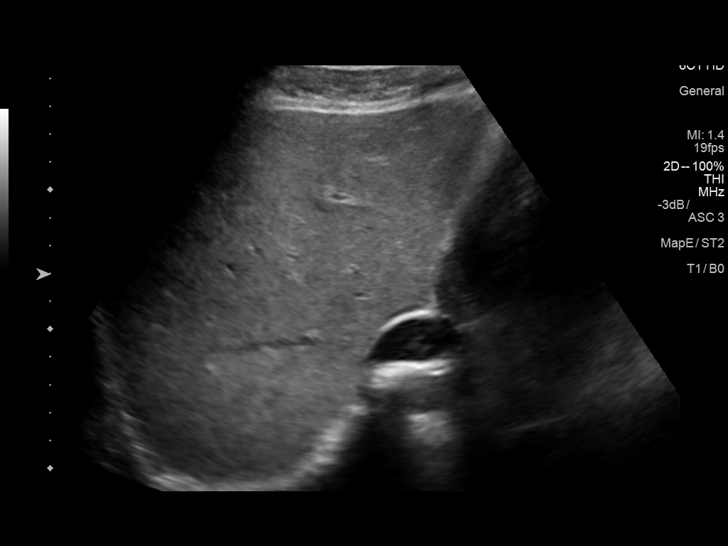
[im 51/51]
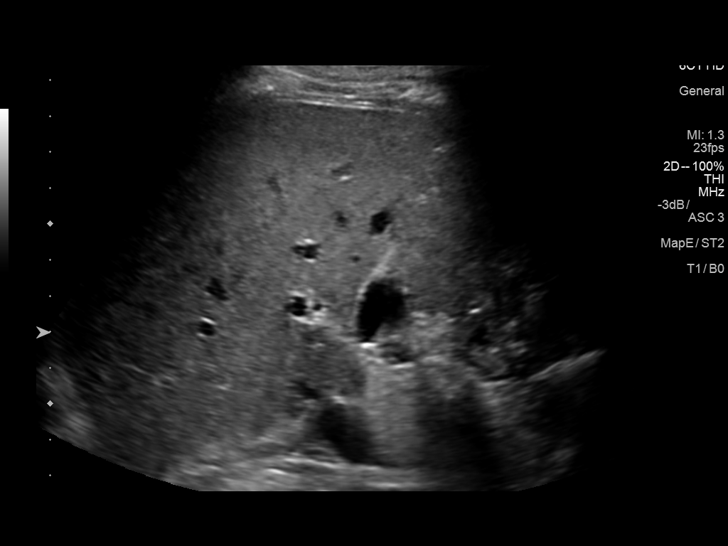

[14 of 25 positions shown; findings below may reference images not displayed]

FINDINGS: Gallbladder:

No gallstones or wall thickening visualized. No sonographic Murphy
sign noted by sonographer.

Common bile duct:

Diameter: 4.4 mm

Liver:

Minimal increased echogenicity without focal mass. Hepatopetal flow
within the portal vein.
IMPRESSION: Minimal increased liver echogenicity possibly representing minimal
fatty infiltration. No focal hepatic mass noted.

Otherwise negative exam.

## 2017-10-17 ENCOUNTER — Ambulatory Visit (INDEPENDENT_AMBULATORY_CARE_PROVIDER_SITE_OTHER): Payer: Medicaid Other | Admitting: Physician Assistant

## 2017-11-07 ENCOUNTER — Encounter (INDEPENDENT_AMBULATORY_CARE_PROVIDER_SITE_OTHER): Payer: Self-pay | Admitting: Physician Assistant

## 2017-11-07 ENCOUNTER — Other Ambulatory Visit: Payer: Self-pay

## 2017-11-07 ENCOUNTER — Ambulatory Visit (INDEPENDENT_AMBULATORY_CARE_PROVIDER_SITE_OTHER): Payer: Medicaid Other | Admitting: Physician Assistant

## 2017-11-07 VITALS — BP 135/82 | HR 97 | Temp 97.9°F | Ht 71.0 in | Wt 163.4 lb

## 2017-11-07 DIAGNOSIS — I10 Essential (primary) hypertension: Secondary | ICD-10-CM | POA: Diagnosis not present

## 2017-11-07 DIAGNOSIS — R350 Frequency of micturition: Secondary | ICD-10-CM | POA: Diagnosis not present

## 2017-11-07 DIAGNOSIS — G8929 Other chronic pain: Secondary | ICD-10-CM

## 2017-11-07 DIAGNOSIS — R3911 Hesitancy of micturition: Secondary | ICD-10-CM | POA: Diagnosis not present

## 2017-11-07 DIAGNOSIS — R319 Hematuria, unspecified: Secondary | ICD-10-CM

## 2017-11-07 DIAGNOSIS — F323 Major depressive disorder, single episode, severe with psychotic features: Secondary | ICD-10-CM

## 2017-11-07 DIAGNOSIS — M5441 Lumbago with sciatica, right side: Secondary | ICD-10-CM | POA: Diagnosis not present

## 2017-11-07 DIAGNOSIS — R441 Visual hallucinations: Secondary | ICD-10-CM

## 2017-11-07 DIAGNOSIS — G4700 Insomnia, unspecified: Secondary | ICD-10-CM | POA: Diagnosis not present

## 2017-11-07 LAB — POCT URINALYSIS DIPSTICK
Glucose, UA: NEGATIVE
Ketones, UA: 80
LEUKOCYTES UA: NEGATIVE
Nitrite, UA: NEGATIVE
Spec Grav, UA: >=1.03 — AB (ref 1.010–1.025)
Urobilinogen, UA: 0.2 E.U./dL
pH, UA: 5.5 (ref 5.0–8.0)

## 2017-11-07 MED ORDER — METHOCARBAMOL 500 MG PO TABS: 500.0000 mg | ORAL_TABLET | Freq: Two times a day (BID) | ORAL | 0 refills | Status: DC

## 2017-11-07 MED ORDER — GABAPENTIN 300 MG PO CAPS: 300.0000 mg | ORAL_CAPSULE | Freq: Three times a day (TID) | ORAL | 5 refills | Status: DC

## 2017-11-07 MED ORDER — NAPROXEN 500 MG PO TABS: 500.0000 mg | ORAL_TABLET | Freq: Two times a day (BID) | ORAL | 0 refills | Status: DC | PRN

## 2017-11-07 MED ORDER — SERTRALINE HCL 50 MG PO TABS: 50.0000 mg | ORAL_TABLET | Freq: Every day | ORAL | 2 refills | Status: DC

## 2017-11-07 MED ORDER — LISINOPRIL-HYDROCHLOROTHIAZIDE 20-12.5 MG PO TABS: 2.0000 | ORAL_TABLET | Freq: Every day | ORAL | 5 refills | Status: DC

## 2017-11-07 MED ORDER — QUETIAPINE FUMARATE 25 MG PO TABS: 25.0000 mg | ORAL_TABLET | Freq: Every day | ORAL | 2 refills | Status: DC

## 2017-11-07 NOTE — Progress Notes (Signed)
Subjective:  Patient ID: Kenneth Andersen, male    DOB: 07/28/1955  Age: 62 y.o. MRN: [id number]  CC: switch PCP  HPI Kenneth Andersen is a 62 y.o. male with a medical history of HTN, aortic atherosclerosis, HCV, and back pain presents as a new patient complaining of urinary frequency. Urinary frequency onset "months and months" ago. Has urinary hesitancy, peroneal pain and back pain but thinks back pain is at baseline    Also complains of insomnia. Does not know why he can not sleep. Estimates to sleep approximately four interrupted hours. PHQ9 12 and GAD7 7 today. Attributed to chronic pain and the change in his lifestyle. Also depressed about having lost his mother. Says he still speaks to her and sees her as clear as he is seeing me. Also sees a dark shadow behind his mother saying negative things but his mother tells him to ignore the negativity. Had a psychiatrist for one month but was lost to f/u due to transportation issues. Has recently set up transportation with the city and will be going to his psychiatrist again. Does not endorse any other symptoms or complaints.     Outpatient Medications Prior to Visit  Medication Sig Dispense Refill  . albuterol (PROVENTIL HFA;VENTOLIN HFA) 108 (90 Base) MCG/ACT inhaler Inhale 1-2 puffs into the lungs every 6 (six) hours as needed for wheezing or shortness of breath.    . tiotropium (SPIRIVA) 18 MCG inhalation capsule Place 18 mcg into inhaler and inhale daily.    . cyclobenzaprine (FLEXERIL) 5 MG tablet Take 5 mg by mouth 3 (three) times daily as needed for muscle spasms.    Marland Kitchen gabapentin (NEURONTIN) 300 MG capsule Take 300 mg by mouth 3 (three) times daily.    . Glecaprevir-Pibrentasvir (MAVYRET) 100-40 MG TABS Take 3 tablets by mouth daily. 84 tablet 1  . ibuprofen (ADVIL,MOTRIN) 600 MG tablet Take 1 tablet (600 mg total) by mouth every 6 (six) hours as needed. 30 tablet 0  . lisinopril-hydrochlorothiazide (PRINZIDE,ZESTORETIC) 20-12.5 MG tablet Take 2  tablets by mouth daily.    Marland Kitchen loratadine (CLARITIN) 10 MG tablet Take 10 mg by mouth daily.    . methocarbamol (ROBAXIN) 500 MG tablet Take 1 tablet (500 mg total) by mouth 2 (two) times daily. 20 tablet 0  . naproxen (NAPROSYN) 500 MG tablet Take 500 mg by mouth 2 (two) times daily as needed.    Marland Kitchen omeprazole (PRILOSEC) 20 MG capsule Take 20 mg by mouth daily.    . predniSONE (DELTASONE) 20 MG tablet 2 tabs po daily x 4 days 8 tablet 0  . QUEtiapine (SEROQUEL) 25 MG tablet Take 25 mg by mouth at bedtime.    . sertraline (ZOLOFT) 50 MG tablet Take 50 mg by mouth daily.     No facility-administered medications prior to visit.      ROS Review of Systems  Constitutional: Negative for chills, fever and malaise/fatigue.  Eyes: Negative for blurred vision.  Respiratory: Negative for shortness of breath.   Cardiovascular: Negative for chest pain and palpitations.  Gastrointestinal: Negative for abdominal pain and nausea.  Genitourinary: Negative for dysuria and hematuria.  Musculoskeletal: Positive for back pain. Negative for joint pain and myalgias.  Skin: Negative for rash.  Neurological: Negative for tingling and headaches.  Psychiatric/Behavioral: Positive for depression and hallucinations. The patient is not nervous/anxious.     Objective:  BP 135/82 (BP Location: Left Arm, Patient Position: Sitting, Cuff Size: Normal)   Pulse 97   Temp 97.9 F (  36.6 C) (Oral)   Ht  (1.803 m)   Wt 163 lb 6.4 oz (74.1 kg)   SpO2 95%   BMI 22.79 kg/m   BP/Weight 11/07/2017 01/30/2017 01/26/2017  Systolic BP 135 132 122  Diastolic BP 82 83 86  Wt. (Lbs) 163.4 - 167  BMI 22.79 - 23.29      Physical Exam  Constitutional: He is oriented to person, place, and time.  Well developed, thin, NAD, polite  HENT:  Head: Normocephalic and atraumatic.  Eyes: No scleral icterus.  Neck: Normal range of motion. Neck supple. No thyromegaly present.  Cardiovascular: Normal rate, regular rhythm and  normal heart sounds.  No lower extremity edema bilaterally. No carotid bruits bilaterally   Pulmonary/Chest: Effort normal and breath sounds normal.  Musculoskeletal: He exhibits no edema.  Moderately to severely limited aROM of the back in all planes of motion  Neurological: He is alert and oriented to person, place, and time.  Skin: Skin is warm and dry. No rash noted. No erythema. No pallor.  Psychiatric: He has a normal mood and affect. His behavior is normal. Thought content normal.  Vitals reviewed.    Assessment & Plan:   1. Urinary frequency - Urinalysis Dipstick  2. Urinary hesitancy - Urinalysis Dipstick - PSA  3. Insomnia, unspecified type - Restart Sertraline and Seroquel  4. Severe major depression with psychotic features (HCC) - sertraline (ZOLOFT) 50 MG tablet; Take 1 tablet (50 mg total) by mouth daily.  Dispense: 30 tablet; Refill: 2 - QUEtiapine (SEROQUEL) 25 MG tablet; Take 1 tablet (25 mg total) by mouth at bedtime.  Dispense: 30 tablet; Refill: 2 - TSH  5. Visual hallucination - Drug Screen, Urine - sertraline (ZOLOFT) 50 MG tablet; Take 1 tablet (50 mg total) by mouth daily.  Dispense: 30 tablet; Refill: 2 - QUEtiapine (SEROQUEL) 25 MG tablet; Take 1 tablet (25 mg total) by mouth at bedtime.  Dispense: 30 tablet; Refill: 2 - RPR  6. Chronic bilateral low back pain with right-sided sciatica - naproxen (NAPROSYN) 500 MG tablet; Take 1 tablet (500 mg total) by mouth 2 (two) times daily as needed.  Dispense: 60 tablet; Refill: 0 - methocarbamol (ROBAXIN) 500 MG tablet; Take 1 tablet (500 mg total) by mouth 2 (two) times daily.  Dispense: 20 tablet; Refill: 0 - gabapentin (NEURONTIN) 300 MG capsule; Take 1 capsule (300 mg total) by mouth 3 (three) times daily.  Dispense: 90 capsule; Refill: 5 - AMB referral to orthopedics  7. Essential hypertension - lisinopril-hydrochlorothiazide (PRINZIDE,ZESTORETIC) 20-12.5 MG tablet; Take 2 tablets by mouth daily.   Dispense: 60 tablet; Refill: 5 - CBC with Differential - Comprehensive metabolic panel - TSH  8. Hematuria, unspecified type - PSA   Meds ordered this encounter  Medications  . sertraline (ZOLOFT) 50 MG tablet    Sig: Take 1 tablet (50 mg total) by mouth daily.    Dispense:  30 tablet    Refill:  2    Order Specific Question:   Supervising Provider    Answer:   Quentin Angst [id number]  . QUEtiapine (SEROQUEL) 25 MG tablet    Sig: Take 1 tablet (25 mg total) by mouth at bedtime.    Dispense:  30 tablet    Refill:  2    Order Specific Question:   Supervising Provider    Answer:   Quentin Angst [id number]  . naproxen (NAPROSYN) 500 MG tablet    Sig: Take 1 tablet (500 mg total) by  mouth 2 (two) times daily as needed.    Dispense:  60 tablet    Refill:  0    Order Specific Question:   Supervising Provider    Answer:   Quentin Angst [id number]  . methocarbamol (ROBAXIN) 500 MG tablet    Sig: Take 1 tablet (500 mg total) by mouth 2 (two) times daily.    Dispense:  20 tablet    Refill:  0    Order Specific Question:   Supervising Provider    Answer:   Quentin Angst [id number]  . lisinopril-hydrochlorothiazide (PRINZIDE,ZESTORETIC) 20-12.5 MG tablet    Sig: Take 2 tablets by mouth daily.    Dispense:  60 tablet    Refill:  5    Order Specific Question:   Supervising Provider    Answer:   Quentin Angst [id number]  . gabapentin (NEURONTIN) 300 MG capsule    Sig: Take 1 capsule (300 mg total) by mouth 3 (three) times daily.    Dispense:  90 capsule    Refill:  5    Order Specific Question:   Supervising Provider    Answer:   Quentin Angst [id number]    Follow-up: Return in about 6 weeks (around 12/19/2017) for depression back pain.   Loletta Specter PA

## 2017-11-07 NOTE — Patient Instructions (Signed)

## 2017-11-08 ENCOUNTER — Telehealth (INDEPENDENT_AMBULATORY_CARE_PROVIDER_SITE_OTHER): Payer: Self-pay

## 2017-11-08 LAB — CBC WITH DIFFERENTIAL/PLATELET
BASOS ABS: 0 10*3/uL (ref 0.0–0.2)
Basos: 1 %
EOS (ABSOLUTE): 0.2 10*3/uL (ref 0.0–0.4)
Eos: 3 %
HEMOGLOBIN: 15.6 g/dL (ref 13.0–17.7)
Hematocrit: 45.8 % (ref 37.5–51.0)
Immature Grans (Abs): 0 10*3/uL (ref 0.0–0.1)
Immature Granulocytes: 0 %
LYMPHS ABS: 2.5 10*3/uL (ref 0.7–3.1)
Lymphs: 50 %
MCH: 30 pg (ref 26.6–33.0)
MCHC: 34.1 g/dL (ref 31.5–35.7)
MCV: 88 fL (ref 79–97)
MONOCYTES: 9 %
MONOS ABS: 0.5 10*3/uL (ref 0.1–0.9)
Neutrophils Absolute: 1.9 10*3/uL (ref 1.4–7.0)
Neutrophils: 37 %
Platelets: 378 10*3/uL (ref 150–379)
RBC: 5.2 x10E6/uL (ref 4.14–5.80)
RDW: 13.9 % (ref 12.3–15.4)
WBC: 5 10*3/uL (ref 3.4–10.8)

## 2017-11-08 LAB — COMPREHENSIVE METABOLIC PANEL
ALBUMIN: 4.6 g/dL (ref 3.6–4.8)
ALT: 12 IU/L (ref 0–44)
AST: 21 IU/L (ref 0–40)
Albumin/Globulin Ratio: 1.8 (ref 1.2–2.2)
Alkaline Phosphatase: 95 IU/L (ref 39–117)
BILIRUBIN TOTAL: 0.4 mg/dL (ref 0.0–1.2)
BUN / CREAT RATIO: 14 (ref 10–24)
BUN: 15 mg/dL (ref 8–27)
CHLORIDE: 103 mmol/L (ref 96–106)
CO2: 22 mmol/L (ref 20–29)
CREATININE: 1.11 mg/dL (ref 0.76–1.27)
Calcium: 9.6 mg/dL (ref 8.6–10.2)
GFR calc Af Amer: 83 mL/min/{1.73_m2} (ref >59)
GFR calc non Af Amer: 72 mL/min/{1.73_m2} (ref >59)
GLUCOSE: 105 mg/dL — AB (ref 65–99)
Globulin, Total: 2.6 g/dL (ref 1.5–4.5)
Potassium: 4 mmol/L (ref 3.5–5.2)
Sodium: 141 mmol/L (ref 134–144)
Total Protein: 7.2 g/dL (ref 6.0–8.5)

## 2017-11-08 LAB — DRUG SCREEN, URINE
Amphetamines, Urine: NEGATIVE ng/mL
Barbiturate screen, urine: NEGATIVE ng/mL
Benzodiazepine Quant, Ur: NEGATIVE ng/mL
CANNABINOID QUANT UR: POSITIVE ng/mL — AB
COCAINE (METAB.): POSITIVE ng/mL — AB
OPIATE QUANT UR: NEGATIVE ng/mL
PCP Quant, Ur: NEGATIVE ng/mL

## 2017-11-08 LAB — TSH: TSH: 0.367 u[IU]/mL — ABNORMAL LOW (ref 0.450–4.500)

## 2017-11-08 LAB — RPR: RPR Ser Ql: NONREACTIVE

## 2017-11-08 NOTE — Telephone Encounter (Signed)
Patient is aware of mildly low TSH and the need to have thyroid panel done at next office visit. UDS positive for cocaine and marijuana and all other labs normal. Maryjean Morn, CMA

## 2017-11-08 NOTE — Telephone Encounter (Signed)
-----   Message from Loletta Specter, PA-C sent at 11/08/2017 12:49 PM EDT ----- Cocaine and Marijuana positive. Pt said he only smoked marijuana. TSH is mildly low. He will need to do thyroid panel at his f/u visit. Rest of labs normal.

## 2017-12-03 ENCOUNTER — Ambulatory Visit (INDEPENDENT_AMBULATORY_CARE_PROVIDER_SITE_OTHER): Payer: Self-pay | Admitting: Orthopaedic Surgery

## 2017-12-14 ENCOUNTER — Emergency Department (HOSPITAL_BASED_OUTPATIENT_CLINIC_OR_DEPARTMENT_OTHER): Payer: No Typology Code available for payment source

## 2017-12-14 ENCOUNTER — Emergency Department (HOSPITAL_COMMUNITY)
Admission: EM | Admit: 2017-12-14 | Discharge: 2017-12-14 | Discharge status: Against Medical Advice | Payer: Medicaid Other

## 2017-12-14 ENCOUNTER — Encounter (HOSPITAL_BASED_OUTPATIENT_CLINIC_OR_DEPARTMENT_OTHER): Payer: Self-pay | Admitting: Emergency Medicine

## 2017-12-14 ENCOUNTER — Other Ambulatory Visit: Payer: Self-pay

## 2017-12-14 ENCOUNTER — Emergency Department (HOSPITAL_BASED_OUTPATIENT_CLINIC_OR_DEPARTMENT_OTHER)
Admission: EM | Admit: 2017-12-14 | Discharge: 2017-12-14 | Disposition: A | Discharge status: Home / Self Care | Payer: No Typology Code available for payment source | Attending: Emergency Medicine | Admitting: Emergency Medicine

## 2017-12-14 DIAGNOSIS — I10 Essential (primary) hypertension: Secondary | ICD-10-CM | POA: Insufficient documentation

## 2017-12-14 DIAGNOSIS — Y939 Activity, unspecified: Secondary | ICD-10-CM | POA: Diagnosis not present

## 2017-12-14 DIAGNOSIS — Y92410 Unspecified street and highway as the place of occurrence of the external cause: Secondary | ICD-10-CM | POA: Diagnosis not present

## 2017-12-14 DIAGNOSIS — S199XXA Unspecified injury of neck, initial encounter: Secondary | ICD-10-CM | POA: Diagnosis present

## 2017-12-14 DIAGNOSIS — M25561 Pain in right knee: Secondary | ICD-10-CM | POA: Diagnosis not present

## 2017-12-14 DIAGNOSIS — Y999 Unspecified external cause status: Secondary | ICD-10-CM | POA: Insufficient documentation

## 2017-12-14 DIAGNOSIS — F17210 Nicotine dependence, cigarettes, uncomplicated: Secondary | ICD-10-CM | POA: Diagnosis not present

## 2017-12-14 DIAGNOSIS — M79641 Pain in right hand: Secondary | ICD-10-CM | POA: Insufficient documentation

## 2017-12-14 DIAGNOSIS — S161XXA Strain of muscle, fascia and tendon at neck level, initial encounter: Secondary | ICD-10-CM | POA: Diagnosis not present

## 2017-12-14 MED ORDER — METHOCARBAMOL 500 MG PO TABS: 500.0000 mg | ORAL_TABLET | Freq: Three times a day (TID) | ORAL | 0 refills | Status: DC | PRN

## 2017-12-14 MED ORDER — IBUPROFEN 800 MG PO TABS: 800.0000 mg | ORAL_TABLET | Freq: Three times a day (TID) | ORAL | 0 refills | Status: AC | PRN
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
## 2017-12-14 NOTE — ED Notes (Signed)
[facility] - patient did not answer when called.
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
## 2017-12-14 NOTE — Discharge Instructions (Signed)

## 2017-12-19 ENCOUNTER — Ambulatory Visit (INDEPENDENT_AMBULATORY_CARE_PROVIDER_SITE_OTHER): Payer: Medicaid Other | Admitting: Physician Assistant

## 2017-12-25 ENCOUNTER — Ambulatory Visit (INDEPENDENT_AMBULATORY_CARE_PROVIDER_SITE_OTHER): Payer: Self-pay | Admitting: Orthopaedic Surgery

## 2018-01-13 ENCOUNTER — Ambulatory Visit (INDEPENDENT_AMBULATORY_CARE_PROVIDER_SITE_OTHER): Payer: Medicaid Other | Admitting: Physician Assistant

## 2018-02-05 ENCOUNTER — Ambulatory Visit (INDEPENDENT_AMBULATORY_CARE_PROVIDER_SITE_OTHER): Payer: Medicaid Other | Admitting: Physician Assistant

## 2018-03-04 ENCOUNTER — Ambulatory Visit (INDEPENDENT_AMBULATORY_CARE_PROVIDER_SITE_OTHER): Payer: Medicaid Other | Admitting: Physician Assistant

## 2018-04-01 ENCOUNTER — Encounter (INDEPENDENT_AMBULATORY_CARE_PROVIDER_SITE_OTHER): Payer: Self-pay | Admitting: Physician Assistant

## 2018-04-01 ENCOUNTER — Other Ambulatory Visit: Payer: Self-pay

## 2018-04-01 ENCOUNTER — Ambulatory Visit (INDEPENDENT_AMBULATORY_CARE_PROVIDER_SITE_OTHER): Payer: Medicaid Other | Admitting: Physician Assistant

## 2018-04-01 VITALS — BP 149/99 | HR 74 | Temp 98.0°F | Ht 71.0 in | Wt 155.6 lb

## 2018-04-01 DIAGNOSIS — Z23 Encounter for immunization: Secondary | ICD-10-CM

## 2018-04-01 DIAGNOSIS — M545 Low back pain, unspecified: Secondary | ICD-10-CM

## 2018-04-01 DIAGNOSIS — Z1211 Encounter for screening for malignant neoplasm of colon: Secondary | ICD-10-CM

## 2018-04-01 DIAGNOSIS — Z760 Encounter for issue of repeat prescription: Secondary | ICD-10-CM

## 2018-04-01 DIAGNOSIS — R7989 Other specified abnormal findings of blood chemistry: Secondary | ICD-10-CM

## 2018-04-01 DIAGNOSIS — E7841 Elevated Lipoprotein(a): Secondary | ICD-10-CM | POA: Diagnosis not present

## 2018-04-01 DIAGNOSIS — I10 Essential (primary) hypertension: Secondary | ICD-10-CM | POA: Diagnosis not present

## 2018-04-01 DIAGNOSIS — F323 Major depressive disorder, single episode, severe with psychotic features: Secondary | ICD-10-CM

## 2018-04-01 DIAGNOSIS — G8929 Other chronic pain: Secondary | ICD-10-CM

## 2018-04-01 MED ORDER — METHOCARBAMOL 500 MG PO TABS: 500.0000 mg | ORAL_TABLET | Freq: Three times a day (TID) | ORAL | 0 refills | Status: DC | PRN

## 2018-04-01 MED ORDER — TIOTROPIUM BROMIDE MONOHYDRATE 18 MCG IN CAPS: 18.0000 ug | ORAL_CAPSULE | Freq: Every day | RESPIRATORY_TRACT | 5 refills | Status: AC

## 2018-04-01 MED ORDER — QUETIAPINE FUMARATE 25 MG PO TABS: 25.0000 mg | ORAL_TABLET | Freq: Every day | ORAL | 3 refills | Status: DC

## 2018-04-01 MED ORDER — NAPROXEN 500 MG PO TABS: 500.0000 mg | ORAL_TABLET | Freq: Two times a day (BID) | ORAL | 0 refills | Status: DC | PRN

## 2018-04-01 MED ORDER — METHOCARBAMOL 500 MG PO TABS: 500.0000 mg | ORAL_TABLET | Freq: Three times a day (TID) | ORAL | 1 refills | Status: AC | PRN

## 2018-04-01 MED ORDER — GABAPENTIN 300 MG PO CAPS
600.0000 mg | ORAL_CAPSULE | Freq: Three times a day (TID) | ORAL | 5 refills | Status: AC
Start: 2018-04-01 — End: ?

## 2018-04-01 MED ORDER — LISINOPRIL-HYDROCHLOROTHIAZIDE 20-12.5 MG PO TABS: 2.0000 | ORAL_TABLET | Freq: Every day | ORAL | 5 refills | Status: AC

## 2018-04-01 MED ORDER — ALBUTEROL SULFATE HFA 108 (90 BASE) MCG/ACT IN AERS: 1.0000 | INHALATION_SPRAY | Freq: Four times a day (QID) | RESPIRATORY_TRACT | 5 refills | Status: AC | PRN

## 2018-04-01 MED ORDER — SERTRALINE HCL 50 MG PO TABS: 50.0000 mg | ORAL_TABLET | Freq: Every day | ORAL | 3 refills | Status: DC

## 2018-04-01 NOTE — Progress Notes (Signed)
Subjective:  Patient ID: Kenneth Andersen, male    DOB: 11/04/1955  Age: 63 y.o. MRN: [id number]  CC: f/u depression and back pain   HPI Kenneth Andersen is a 62 y.o. male with a medical history of HTN, aortic atherosclerosis, HCV, and back pain presents to f/u on depression. States he continues with depression. PHQ9 12 and GAD7 7 nineteen weeks ago. Was told to f/u in six weeks but never did. PHQ9 13 and GAD7 11 today. Says he is not taking his sertraline or seroquel because he ran out.     Continues with lower back pain but there is no radiation down the legs at this visit. Requests refills for gabapentin to help relieve pain. Does not endorse any other symptoms or complaints.     Outpatient Medications Prior to Visit  Medication Sig Dispense Refill  . albuterol (PROVENTIL HFA;VENTOLIN HFA) 108 (90 Base) MCG/ACT inhaler Inhale 1-2 puffs into the lungs every 6 (six) hours as needed for wheezing or shortness of breath.    . gabapentin (NEURONTIN) 300 MG capsule Take 1 capsule (300 mg total) by mouth 3 (three) times daily. 90 capsule 5  . lisinopril-hydrochlorothiazide (PRINZIDE,ZESTORETIC) 20-12.5 MG tablet Take 2 tablets by mouth daily. 60 tablet 5  . tiotropium (SPIRIVA) 18 MCG inhalation capsule Place 18 mcg into inhaler and inhale daily.    . methocarbamol (ROBAXIN) 500 MG tablet Take 1 tablet (500 mg total) by mouth every 8 (eight) hours as needed for muscle spasms. (Patient not taking: Reported on 04/01/2018) 20 tablet 0  . naproxen (NAPROSYN) 500 MG tablet Take 1 tablet (500 mg total) by mouth 2 (two) times daily as needed. (Patient not taking: Reported on 04/01/2018) 60 tablet 0  . QUEtiapine (SEROQUEL) 25 MG tablet Take 1 tablet (25 mg total) by mouth at bedtime. (Patient not taking: Reported on 04/01/2018) 30 tablet 2  . sertraline (ZOLOFT) 50 MG tablet Take 1 tablet (50 mg total) by mouth daily. (Patient not taking: Reported on 04/01/2018) 30 tablet 2   No facility-administered medications  prior to visit.      ROS Review of Systems  Constitutional: Negative for chills, fever and malaise/fatigue.  Eyes: Negative for blurred vision.  Respiratory: Negative for shortness of breath.   Cardiovascular: Negative for chest pain and palpitations.  Gastrointestinal: Negative for abdominal pain and nausea.  Genitourinary: Negative for dysuria and hematuria.  Musculoskeletal: Positive for back pain. Negative for joint pain and myalgias.  Skin: Negative for rash.  Neurological: Negative for tingling and headaches.  Psychiatric/Behavioral: Negative for depression. The patient is not nervous/anxious.     Objective:  BP (!) 149/99 (BP Location: Left Arm, Patient Position: Sitting, Cuff Size: Normal)   Pulse 74   Temp 98 F (36.7 C) (Oral)   Ht 5\' 11"  (1.803 m)   Wt 155 lb 9.6 oz (70.6 kg)   SpO2 98%   BMI 21.70 kg/m   BP/Weight 04/01/2018 12/14/2017 11/07/2017  Systolic BP 149 149 135  Diastolic BP 99 98 82  Wt. (Lbs) 155.6 165 163.4  BMI 21.7 23.01 22.79      Physical Exam  Constitutional: He is oriented to person, place, and time.  Well developed, well nourished, NAD, polite  HENT:  Head: Normocephalic and atraumatic.  Eyes: No scleral icterus.  Neck: Normal range of motion. Neck supple. No thyromegaly present.  Cardiovascular: Normal rate, regular rhythm and normal heart sounds.  Pulmonary/Chest: Effort normal and breath sounds normal.  Abdominal: Soft. Bowel sounds are normal.  There is no tenderness.  Musculoskeletal: He exhibits no edema.  Neurological: He is alert and oriented to person, place, and time.  Skin: Skin is warm and dry. No rash noted. No erythema. No pallor.  Psychiatric: He has a normal mood and affect. His behavior is normal. Thought content normal.  Vitals reviewed.    Assessment & Plan:   1. Chronic midline low back pain without sciatica - Refill naproxen (NAPROSYN) 500 MG tablet; Take 1 tablet (500 mg total) by mouth 2 (two) times daily as  needed.  Dispense: 60 tablet; Refill: 0 - Refill gabapentin (NEURONTIN) 300 MG capsule; Take 2 capsules (600 mg total) by mouth 3 (three) times daily.  Dispense: 180 capsule; Refill: 5 - Refill methocarbamol (ROBAXIN) 500 MG tablet; Take 1 tablet (500 mg total) by mouth every 8 (eight) hours as needed for muscle spasms.  Dispense: 20 tablet; Refill: 1  2. Severe major depression with psychotic features (HCC) - Refill sertraline (ZOLOFT) 50 MG tablet; Take 1 tablet (50 mg total) by mouth daily.  Dispense: 30 tablet; Refill: 3 - Refill QUEtiapine (SEROQUEL) 25 MG tablet; Take 1 tablet (25 mg total) by mouth at bedtime.  Dispense: 30 tablet; Refill: 3  3. Elevated lipoprotein(a) - Lipid panel  4. Essential hypertension - Comprehensive metabolic panel - Refill lisinopril-hydrochlorothiazide (PRINZIDE,ZESTORETIC) 20-12.5 MG tablet; Take 2 tablets by mouth daily.  Dispense: 60 tablet; Refill: 5  5. Need for Tdap vaccination - Tdap vaccine greater than or equal to 7yo IM  6. Need for prophylactic vaccination and inoculation against influenza - Flu Vaccine QUAD 6+ mos PF IM (Fluarix Quad PF)  7. Screening for colon cancer - Fecal occult blood, imunochemical; Future  8. Abnormal TSH - Thyroid Panel With TSH  9. Medication refill - tiotropium (SPIRIVA) 18 MCG inhalation capsule; Place 1 capsule (18 mcg total) into inhaler and inhale daily.  Dispense: 30 capsule; Refill: 5 - albuterol (PROVENTIL HFA;VENTOLIN HFA) 108 (90 Base) MCG/ACT inhaler; Inhale 1-2 puffs into the lungs every 6 (six) hours as needed for wheezing or shortness of breath.  Dispense: 1 Inhaler; Refill: 5   Meds ordered this encounter  Medications  . sertraline (ZOLOFT) 50 MG tablet    Sig: Take 1 tablet (50 mg total) by mouth daily.    Dispense:  30 tablet    Refill:  3    Order Specific Question:   Supervising Provider    Answer:   Hoy RegisterNEWLIN, ENOBONG [[id number]]  . tiotropium (SPIRIVA) 18 MCG inhalation capsule    Sig: Place  1 capsule (18 mcg total) into inhaler and inhale daily.    Dispense:  30 capsule    Refill:  5    Order Specific Question:   Supervising Provider    Answer:   Hoy RegisterNEWLIN, ENOBONG [[id number]]  . QUEtiapine (SEROQUEL) 25 MG tablet    Sig: Take 1 tablet (25 mg total) by mouth at bedtime.    Dispense:  30 tablet    Refill:  3    Order Specific Question:   Supervising Provider    Answer:   Hoy RegisterNEWLIN, ENOBONG [[id number]]  . naproxen (NAPROSYN) 500 MG tablet    Sig: Take 1 tablet (500 mg total) by mouth 2 (two) times daily as needed.    Dispense:  60 tablet    Refill:  0    Order Specific Question:   Supervising Provider    Answer:   Hoy RegisterNEWLIN, ENOBONG [[id number]]  . DISCONTD: methocarbamol (ROBAXIN) 500 MG tablet    Sig:  Take 1 tablet (500 mg total) by mouth every 8 (eight) hours as needed for muscle spasms.    Dispense:  20 tablet    Refill:  0    Order Specific Question:   Supervising Provider    Answer:   Hoy Register [[id number]]  . lisinopril-hydrochlorothiazide (PRINZIDE,ZESTORETIC) 20-12.5 MG tablet    Sig: Take 2 tablets by mouth daily.    Dispense:  60 tablet    Refill:  5    Order Specific Question:   Supervising Provider    Answer:   Hoy Register [[id number]]  . gabapentin (NEURONTIN) 300 MG capsule    Sig: Take 2 capsules (600 mg total) by mouth 3 (three) times daily.    Dispense:  180 capsule    Refill:  5    Order Specific Question:   Supervising Provider    Answer:   Hoy Register [[id number]]  . albuterol (PROVENTIL HFA;VENTOLIN HFA) 108 (90 Base) MCG/ACT inhaler    Sig: Inhale 1-2 puffs into the lungs every 6 (six) hours as needed for wheezing or shortness of breath.    Dispense:  1 Inhaler    Refill:  5    Order Specific Question:   Supervising Provider    Answer:   Hoy Register [[id number]]  . methocarbamol (ROBAXIN) 500 MG tablet    Sig: Take 1 tablet (500 mg total) by mouth every 8 (eight) hours as needed for muscle spasms.    Dispense:  20 tablet    Refill:  1    Order Specific Question:    Supervising Provider    Answer:   Hoy Register [[id number]]    Follow-up: Return in about 4 weeks (around 04/29/2018) for HTN, depression.   Loletta Specter PA

## 2018-04-01 NOTE — Patient Instructions (Signed)

## 2018-04-02 ENCOUNTER — Other Ambulatory Visit (INDEPENDENT_AMBULATORY_CARE_PROVIDER_SITE_OTHER): Payer: Self-pay | Admitting: Physician Assistant

## 2018-04-02 DIAGNOSIS — E7841 Elevated Lipoprotein(a): Secondary | ICD-10-CM

## 2018-04-02 LAB — COMPREHENSIVE METABOLIC PANEL
A/G RATIO: 2.1 (ref 1.2–2.2)
ALK PHOS: 98 IU/L (ref 39–117)
ALT: 18 IU/L (ref 0–44)
AST: 22 IU/L (ref 0–40)
Albumin: 5.1 g/dL — ABNORMAL HIGH (ref 3.6–4.8)
BILIRUBIN TOTAL: 0.5 mg/dL (ref 0.0–1.2)
BUN / CREAT RATIO: 9 — AB (ref 10–24)
BUN: 8 mg/dL (ref 8–27)
CHLORIDE: 103 mmol/L (ref 96–106)
CO2: 22 mmol/L (ref 20–29)
Calcium: 9.9 mg/dL (ref 8.6–10.2)
Creatinine, Ser: 0.92 mg/dL (ref 0.76–1.27)
GFR calc non Af Amer: 90 mL/min/{1.73_m2} (ref >59)
GFR, EST AFRICAN AMERICAN: 104 mL/min/{1.73_m2} (ref >59)
GLUCOSE: 82 mg/dL (ref 65–99)
Globulin, Total: 2.4 g/dL (ref 1.5–4.5)
POTASSIUM: 4.9 mmol/L (ref 3.5–5.2)
Sodium: 144 mmol/L (ref 134–144)
Total Protein: 7.5 g/dL (ref 6.0–8.5)

## 2018-04-02 LAB — LIPID PANEL
CHOL/HDL RATIO: 3 ratio (ref 0.0–5.0)
Cholesterol, Total: 188 mg/dL (ref 100–199)
HDL: 63 mg/dL (ref >39)
LDL Calculated: 114 mg/dL — ABNORMAL HIGH (ref 0–99)
TRIGLYCERIDES: 57 mg/dL (ref 0–149)
VLDL Cholesterol Cal: 11 mg/dL (ref 5–40)

## 2018-04-02 LAB — THYROID PANEL WITH TSH
FREE THYROXINE INDEX: 2.2 (ref 1.2–4.9)
T3 UPTAKE RATIO: 29 % (ref 24–39)
T4, Total: 7.7 ug/dL (ref 4.5–12.0)
TSH: 0.338 u[IU]/mL — AB (ref 0.450–4.500)

## 2018-04-02 MED ORDER — ATORVASTATIN CALCIUM 20 MG PO TABS: 20.0000 mg | ORAL_TABLET | Freq: Every day | ORAL | 1 refills | Status: DC

## 2018-04-03 ENCOUNTER — Telehealth (INDEPENDENT_AMBULATORY_CARE_PROVIDER_SITE_OTHER): Payer: Self-pay

## 2018-04-03 ENCOUNTER — Other Ambulatory Visit (INDEPENDENT_AMBULATORY_CARE_PROVIDER_SITE_OTHER): Payer: Self-pay | Admitting: Physician Assistant

## 2018-04-03 DIAGNOSIS — E7841 Elevated Lipoprotein(a): Secondary | ICD-10-CM

## 2018-04-03 MED ORDER — ATORVASTATIN CALCIUM 20 MG PO TABS: 20.0000 mg | ORAL_TABLET | Freq: Every day | ORAL | 1 refills | Status: AC

## 2018-04-03 NOTE — Telephone Encounter (Signed)
Sent to [facility].

## 2018-04-03 NOTE — Telephone Encounter (Signed)
-----   Message from Loletta Specter, PA-C sent at 04/02/2018  6:25 PM EDT ----- Subclinical hyperthyroidism. Recheck in three months. No medication necessary at this point. Cholesterol mildly elevated. I have sent atorvastatin to [facility] on [address].

## 2018-04-03 NOTE — Telephone Encounter (Signed)
Patient is aware of subclinical hyperthyroidism, recheck thyroid in three months; no medication necessary at this point. Did explain to patient what subclinical hyperthyroidism meant. He is aware that his cholesterol Is mildly elevated and atorvastatin was sent to help reduce cholesterol. Atorvastatin was sent to [facility], patient needs it to be sent to [facility]. Maryjean Morn, CMA

## 2018-04-09 ENCOUNTER — Encounter (HOSPITAL_COMMUNITY): Payer: Self-pay

## 2018-04-09 ENCOUNTER — Emergency Department (HOSPITAL_COMMUNITY): Payer: Medicaid Other

## 2018-04-09 ENCOUNTER — Emergency Department (HOSPITAL_COMMUNITY)
Admission: EM | Admit: 2018-04-09 | Discharge: 2018-04-09 | Disposition: A | Discharge status: Home / Self Care | Payer: Medicaid Other | Attending: Emergency Medicine | Admitting: Emergency Medicine

## 2018-04-09 DIAGNOSIS — K529 Noninfective gastroenteritis and colitis, unspecified: Secondary | ICD-10-CM | POA: Insufficient documentation

## 2018-04-09 DIAGNOSIS — R1030 Lower abdominal pain, unspecified: Secondary | ICD-10-CM | POA: Diagnosis present

## 2018-04-09 DIAGNOSIS — Z79899 Other long term (current) drug therapy: Secondary | ICD-10-CM | POA: Insufficient documentation

## 2018-04-09 DIAGNOSIS — K219 Gastro-esophageal reflux disease without esophagitis: Secondary | ICD-10-CM | POA: Diagnosis not present

## 2018-04-09 DIAGNOSIS — F17210 Nicotine dependence, cigarettes, uncomplicated: Secondary | ICD-10-CM | POA: Diagnosis not present

## 2018-04-09 DIAGNOSIS — I10 Essential (primary) hypertension: Secondary | ICD-10-CM | POA: Insufficient documentation

## 2018-04-09 LAB — COMPREHENSIVE METABOLIC PANEL
ALBUMIN: 4.1 g/dL (ref 3.5–5.0)
ALT: 16 U/L (ref 0–44)
AST: 33 U/L (ref 15–41)
Alkaline Phosphatase: 68 U/L (ref 38–126)
Anion gap: 10 (ref 5–15)
BILIRUBIN TOTAL: 0.9 mg/dL (ref 0.3–1.2)
BUN: 5 mg/dL — ABNORMAL LOW (ref 6–20)
CHLORIDE: 100 mmol/L (ref 98–111)
CO2: 27 mmol/L (ref 22–32)
Calcium: 9.5 mg/dL (ref 8.9–10.3)
Creatinine, Ser: 1.02 mg/dL (ref 0.61–1.24)
GFR calc Af Amer: >60 mL/min (ref >60)
GLUCOSE: 94 mg/dL (ref 70–99)
POTASSIUM: 5.2 mmol/L — AB (ref 3.5–5.1)
Sodium: 137 mmol/L (ref 135–145)
TOTAL PROTEIN: 6.9 g/dL (ref 6.5–8.1)

## 2018-04-09 LAB — URINALYSIS, ROUTINE W REFLEX MICROSCOPIC
BACTERIA UA: NONE SEEN
Bilirubin Urine: NEGATIVE
Glucose, UA: NEGATIVE mg/dL
Ketones, ur: 20 mg/dL — AB
Leukocytes, UA: NEGATIVE
Nitrite: NEGATIVE
PROTEIN: NEGATIVE mg/dL
SPECIFIC GRAVITY, URINE: 1.013 (ref 1.005–1.030)
pH: 5 (ref 5.0–8.0)

## 2018-04-09 LAB — CBC
HEMATOCRIT: 51.4 % (ref 39.0–52.0)
Hemoglobin: 16.9 g/dL (ref 13.0–17.0)
MCH: 29.8 pg (ref 26.0–34.0)
MCHC: 32.9 g/dL (ref 30.0–36.0)
MCV: 90.7 fL (ref 78.0–100.0)
Platelets: 300 10*3/uL (ref 150–400)
RBC: 5.67 MIL/uL (ref 4.22–5.81)
RDW: 13.2 % (ref 11.5–15.5)
WBC: 5.6 10*3/uL (ref 4.0–10.5)

## 2018-04-09 LAB — LIPASE, BLOOD: LIPASE: 27 U/L (ref 11–51)

## 2018-04-09 MED ORDER — IOHEXOL 300 MG/ML  SOLN
80.0000 mL | Freq: Once | INTRAMUSCULAR | Status: AC | PRN
  Administered 2018-04-09: 80 mL via INTRAVENOUS

## 2018-04-09 NOTE — Discharge Instructions (Signed)
Mr. Kenneth Andersen,   It was a pleasure taking care of you here in the emergency department.  Your symptoms are consistent with a stomach bug infection and a component of acid reflux.  As we discussed, the CT scan showed that your prostate was enlarged so please follow-up with your primary care doctor this.  Please continue to drink plenty of fluids and maintain your regular diet.  ~Take Care

## 2018-04-09 NOTE — ED Notes (Addendum)
Pt transported to CT ?

## 2018-04-09 NOTE — ED Provider Notes (Signed)
[facility] EMERGENCY DEPARTMENT Provider Note   CSN: [id number] Arrival date & time: 04/09/18  0732     History   Chief Complaint Chief Complaint  Patient presents with  . Medication Reaction    HPI Kenneth Andersen is a 62 y.o. male with medical history significant for hypertension, depression and alcohol use here for evaluation of lower abdominal pain.  He was in his usual state of health until 2 days ago when he started experiencing abdominal soreness/achiness.  Initially he attributed this to constipation and took a laxative.  He subsequently had 3 episodes of loose stools which did not relieve his symptoms.  He also endorse 3-4 episodes of nonbilious, nonbloody emesis and also describes a sensation of burning epigastrium.  His abdominal pain is not related to diet and he denies hematochezia, melena, fevers, chills, hemoptysis.  Currently he rates his pain at a 9/10. He also described a sensation of burning epigastrium.    Past Medical History:  Diagnosis Date  . Back pain   . Hypertension     Patient Active Problem List   Diagnosis Date Noted  . Chronic hepatitis C without hepatic coma (HCC) 12/24/2016    Past Surgical History:  Procedure Laterality Date  . TENDON EXPLORATION Left 05/31/2014   Procedure: ORIF LEFT THUMB WITH NAIL BED REPAIR;  Surgeon: Dairl Ponder, MD;  Location: [facility] OR;  Service: Orthopedics;  Laterality: Left;        Home Medications    Prior to Admission medications   Medication Sig Start Date End Date Taking? Authorizing Provider  albuterol (PROVENTIL HFA;VENTOLIN HFA) 108 (90 Base) MCG/ACT inhaler Inhale 1-2 puffs into the lungs every 6 (six) hours as needed for wheezing or shortness of breath. 04/01/18   Loletta Specter, PA-C  atorvastatin (LIPITOR) 20 MG tablet Take 1 tablet (20 mg total) by mouth daily. 04/03/18   Loletta Specter, PA-C  gabapentin (NEURONTIN) 300 MG capsule Take 2 capsules (600 mg total) by mouth 3  (three) times daily. 04/01/18   Loletta Specter, PA-C  lisinopril-hydrochlorothiazide (PRINZIDE,ZESTORETIC) 20-12.5 MG tablet Take 2 tablets by mouth daily. 04/01/18   Loletta Specter, PA-C  methocarbamol (ROBAXIN) 500 MG tablet Take 1 tablet (500 mg total) by mouth every 8 (eight) hours as needed for muscle spasms. 04/01/18   Loletta Specter, PA-C  naproxen (NAPROSYN) 500 MG tablet Take 1 tablet (500 mg total) by mouth 2 (two) times daily as needed. 04/01/18   Loletta Specter, PA-C  QUEtiapine (SEROQUEL) 25 MG tablet Take 1 tablet (25 mg total) by mouth at bedtime. 04/01/18   Loletta Specter, PA-C  sertraline (ZOLOFT) 50 MG tablet Take 1 tablet (50 mg total) by mouth daily. 04/01/18   Loletta Specter, PA-C  tiotropium (SPIRIVA) 18 MCG inhalation capsule Place 1 capsule (18 mcg total) into inhaler and inhale daily. 04/01/18   Loletta Specter, PA-C    Family History No family history on file.  Social History Social History   Tobacco Use  . Smoking status: Current Every Day Smoker    Types: Cigarettes  . Smokeless tobacco: Never Used  Substance Use Topics  . Alcohol use: Yes    Comment: "occasionally"  . Drug use: No   EtOH: Plan of liquor on the weekends with last use this past weekend THC: Current use, last use this past weekend Cigarette: Smokes 2 pack/week since 62 years old.  Allergies   Patient has no known allergies.   Review  of Systems Review of Systems  Constitutional: Negative.   Respiratory: Negative.   Cardiovascular: Negative.   Gastrointestinal: Positive for abdominal pain, diarrhea, nausea and vomiting. Negative for blood in stool and constipation.  Musculoskeletal: Negative.   Skin: Negative.   Neurological: Negative.   Psychiatric/Behavioral: Negative.      Physical Exam Updated Vital Signs BP (!) 154/91 (BP Location: Right Arm)   Pulse 69   SpO2 97%   Physical Exam  Constitutional: He appears well-developed and well-nourished. No  distress.  HENT:  Head: Normocephalic and atraumatic.  Cardiovascular: Normal rate, regular rhythm and normal heart sounds. Exam reveals no gallop and no friction rub.  No murmur heard. Pulmonary/Chest: Effort normal and breath sounds normal. No stridor. No respiratory distress. He has no wheezes.  Abdominal: Bowel sounds are normal. He exhibits no distension and no mass. There is tenderness. There is guarding. There is no rebound.  Neurological: He is alert.  Skin: Skin is warm. He is not diaphoretic.  Psychiatric: He has a normal mood and affect. His behavior is normal. Judgment and thought content normal.     ED Treatments / Results  Labs (all labs ordered are listed, but only abnormal results are displayed) Labs Reviewed  COMPREHENSIVE METABOLIC PANEL - Abnormal; Notable for the following components:      Result Value   Potassium 5.2 (*)    BUN 5 (*)    All other components within normal limits  URINALYSIS, ROUTINE W REFLEX MICROSCOPIC - Abnormal; Notable for the following components:   Hgb urine dipstick MODERATE (*)    Ketones, ur 20 (*)    All other components within normal limits  LIPASE, BLOOD  CBC    EKG None  Radiology Ct Abdomen Pelvis W Contrast  Result Date: 04/09/2018 CLINICAL DATA:  62 year old presenting with acute onset of periumbilical abdominal pain that began 3 days ago, associated with nausea and vomiting. EXAM: CT ABDOMEN AND PELVIS WITH CONTRAST TECHNIQUE: Multidetector CT imaging of the abdomen and pelvis was performed using the standard protocol following bolus administration of intravenous contrast. CONTRAST:  80mL OMNIPAQUE IOHEXOL 300 MG/ML IV. Oral contrast was not administered. COMPARISON:  01/11/2015. FINDINGS: Lower chest: Minimal atelectasis deep in the RIGHT LOWER LOBE. Visualized lung bases otherwise clear. Heart size normal and stable. Hepatobiliary: Liver normal in size and appearance. Gallbladder normal in appearance without calcified  gallstones. No biliary ductal dilation. Pancreas: Normal in appearance without evidence of mass, ductal dilation, or inflammation. Spleen: Normal in size and appearance. Adrenals/Urinary Tract: Approximate 0.7 x 1.2 cm nodule arising from the LATERAL limb of the LEFT adrenal gland, unchanged since the prior CT. No new or significant abnormality involving either adrenal gland. Both kidneys normal in size without focal parenchymal abnormality. Renal cortex well-preserved bilaterally. No visible urinary tract calculi. No hydronephrosis. Urinary bladder decompressed and normal in appearance. Stomach/Bowel: Normal-appearing decompressed stomach. Normal-appearing small bowel. Liquid stool throughout the normal appearing decompressed colon. Vascular/Lymphatic: Moderate aortoiliac atherosclerosis without evidence of aneurysm as noted previously. Normal-appearing portal venous and systemic venous systems. No pathologic lymphadenopathy. Reproductive: Focal enhancement involving the central portion of the prostate gland which is upper normal in size. Normal seminal vesicles. Small LEFT scrotal hydrocele noted. Other: None. Musculoskeletal: Thoracolumbar dextroscoliosis. Severe degenerative disc disease and spondylosis at L1-2, L3-4, L4-5. Diffuse facet degenerative changes throughout the lumbar spine. Severe multifactorial spinal stenosis at L3-4 and L4-5. Degenerative changes involving the sacroiliac joints. IMPRESSION: 1. No acute abnormalities involving the abdomen or pelvis. 2. Focal enhancement  involving the central portion of the prostate gland which is likely not pathologic. Please correlate with PSA. 3. Stable approximate 1 cm benign LEFT adrenal adenoma. 4.  Aortic Atherosclerosis (ICD10-170.0) 5. Skeletal findings as above, most significantly severe multifactorial spinal stenosis at L3-4 and L4-5. Electronically Signed   By: Hulan Saas M.D.   On: 04/09/2018 13:20    Procedures Procedures (including critical  care time)  Medications Ordered in ED Medications  iohexol (OMNIPAQUE) 300 MG/ML solution 80 mL (80 mLs Intravenous Contrast Given 04/09/18 1247)     Initial Impression / Assessment and Plan / ED Course  I have reviewed the triage vital signs and the nursing notes.  Pertinent labs & imaging results that were available during my care of the patient were reviewed by me and considered in my medical decision making (see chart for details).   Patient is a 62 year old African-American gentleman with history of alcohol use presenting with lower abdominal soreness, diarrhea in the setting of laxatives use, nausea, vomiting and decreased appetite.  Physical exam noticeable for tenderness to palpation of the abdomen with guarding. Differential diagnosis for this gentleman includes pancreatitis, viral gastroenteritis, GERD, diverticulosis.  Labs reveal normal lipase, CBC unremarkable, CMP shows mildly elevated potassium at 5.2, UA positive for ketones and moderate hemoglobin. This will have to be followed by his PCP to make sure this is true transient hematuria. He is a smoker which puts him at risk for bladder malignancy.  Pertaining to hyperkalemia, he is on an ACE inhibitor and I will advise for him to follow-up with his primary care provider and monitor his potassium.   CT abdomen and pelvis did not reveal pancreatitis, diverticulitis, bowel obstruction. It did however reveal Focal enhancement involving the central portion of the prostate gland which is likely not pathologic per radiology and Stable approximate 1 cm benign LEFT adrenal adenoma. I will have his PCP follow up on this.   Differential for his presenting symptoms include gastroenteritis and a component of GERD.  Final Clinical Impressions(s) / ED Diagnoses   Final diagnoses:  Gastroenteritis  Gastroesophageal reflux disease without esophagitis    ED Discharge Orders    None       Yvette Rack, MD 04/09/18 1447    Little,  Ambrose Finland, MD 04/12/18 [phone]

## 2018-04-09 NOTE — ED Triage Notes (Signed)
Pt from home via ems; went to PCP earlier this week, prescribed new meds for HTN; since he started taking the new meds pt has been experiencing lower abd pain and nausea; vomited twice this am; pt also c/o constipation for the last few days, took laxative, having small amounts of watery stool  150/88 P 72 97% RA CBG 106 RR 16

## 2018-04-16 ENCOUNTER — Other Ambulatory Visit (INDEPENDENT_AMBULATORY_CARE_PROVIDER_SITE_OTHER): Payer: Self-pay | Admitting: Physician Assistant

## 2018-04-16 DIAGNOSIS — M545 Low back pain, unspecified: Secondary | ICD-10-CM

## 2018-04-16 DIAGNOSIS — G8929 Other chronic pain: Secondary | ICD-10-CM

## 2018-04-16 NOTE — Telephone Encounter (Signed)
FWD to PCP. Tempestt S Roberts, CMA  

## 2018-04-29 ENCOUNTER — Ambulatory Visit (INDEPENDENT_AMBULATORY_CARE_PROVIDER_SITE_OTHER): Payer: Medicaid Other | Admitting: Physician Assistant

## 2018-04-29 ENCOUNTER — Other Ambulatory Visit: Payer: Self-pay

## 2018-04-29 ENCOUNTER — Encounter (INDEPENDENT_AMBULATORY_CARE_PROVIDER_SITE_OTHER): Payer: Self-pay | Admitting: Physician Assistant

## 2018-04-29 VITALS — BP 121/88 | HR 92 | Temp 97.7°F | Ht 71.0 in | Wt 159.0 lb

## 2018-04-29 DIAGNOSIS — F323 Major depressive disorder, single episode, severe with psychotic features: Secondary | ICD-10-CM

## 2018-04-29 DIAGNOSIS — Z1211 Encounter for screening for malignant neoplasm of colon: Secondary | ICD-10-CM | POA: Diagnosis not present

## 2018-04-29 DIAGNOSIS — I10 Essential (primary) hypertension: Secondary | ICD-10-CM | POA: Diagnosis not present

## 2018-04-29 DIAGNOSIS — R319 Hematuria, unspecified: Secondary | ICD-10-CM | POA: Diagnosis not present

## 2018-04-29 DIAGNOSIS — N3943 Post-void dribbling: Secondary | ICD-10-CM

## 2018-04-29 LAB — POCT URINALYSIS DIPSTICK
BILIRUBIN UA: NEGATIVE
GLUCOSE UA: NEGATIVE
Ketones, UA: NEGATIVE
LEUKOCYTES UA: NEGATIVE
Nitrite, UA: NEGATIVE
PH UA: 5.5 (ref 5.0–8.0)
Protein, UA: NEGATIVE
SPEC GRAV UA: 1.025 (ref 1.010–1.025)
Urobilinogen, UA: 0.2 E.U./dL

## 2018-04-29 NOTE — Progress Notes (Signed)
Subjective:  Patient ID: Kenneth Andersen, male    DOB: 1955/09/05  Age: 62 y.o. MRN: [id number]  CC: f/u HTN and depression   HPI  Kenneth Andersen a 62 y.o.malewith a medical history of HTN, aortic atherosclerosis, HCV, and back pain presents for HTN and depression f/u. PHQ9 13 and GAD7 11 four weeks ago. Had not taking Sertraline or Seroquel as prescribed by his psychiatrist. Meds were refilled and he states he is still not taking them due to GI side effect which led him to the hospital. Work up negative for diverticulitis, pancreatitis, bowel obstruction. Lipase, CBC, LFTs, creatinine normal. K was 5.2 mmol/L and urine revealed moderate blood and ketones. Negative urinary glucose. PHQ9 11 and GAD7 8 today. Still has family issues that drive his depression and anxiety. Does not endorse any symptoms or complaints today.    Outpatient Medications Prior to Visit  Medication Sig Dispense Refill  . albuterol (PROVENTIL HFA;VENTOLIN HFA) 108 (90 Base) MCG/ACT inhaler Inhale 1-2 puffs into the lungs every 6 (six) hours as needed for wheezing or shortness of breath. 1 Inhaler 5  . atorvastatin (LIPITOR) 20 MG tablet Take 1 tablet (20 mg total) by mouth daily. 90 tablet 1  . gabapentin (NEURONTIN) 300 MG capsule Take 2 capsules (600 mg total) by mouth 3 (three) times daily. 180 capsule 5  . lisinopril-hydrochlorothiazide (PRINZIDE,ZESTORETIC) 20-12.5 MG tablet Take 2 tablets by mouth daily. 60 tablet 5  . methocarbamol (ROBAXIN) 500 MG tablet Take 1 tablet (500 mg total) by mouth every 8 (eight) hours as needed for muscle spasms. 20 tablet 1  . naproxen (NAPROSYN) 500 MG tablet Take 1 tablet (500 mg total) by mouth 2 (two) times daily as needed. 60 tablet 0  . QUEtiapine (SEROQUEL) 25 MG tablet Take 1 tablet (25 mg total) by mouth at bedtime. 30 tablet 3  . sertraline (ZOLOFT) 50 MG tablet Take 1 tablet (50 mg total) by mouth daily. 30 tablet 3  . tiotropium (SPIRIVA) 18 MCG inhalation capsule Place 1  capsule (18 mcg total) into inhaler and inhale daily. 30 capsule 5   No facility-administered medications prior to visit.      ROS Review of Systems  Constitutional: Negative for chills, fever and malaise/fatigue.  Eyes: Negative for blurred vision.  Respiratory: Negative for shortness of breath.   Cardiovascular: Negative for chest pain and palpitations.  Gastrointestinal: Negative for abdominal pain and nausea.  Genitourinary: Negative for dysuria and hematuria.  Musculoskeletal: Negative for joint pain and myalgias.  Skin: Negative for rash.  Neurological: Negative for tingling and headaches.  Psychiatric/Behavioral: Negative for depression. The patient is not nervous/anxious.     Objective:  BP 121/88 (BP Location: Left Arm, Patient Position: Sitting, Cuff Size: Normal)   Pulse 92   Temp 97.7 F (36.5 C) (Oral)   Ht 5\' 11"  (1.803 m)   Wt 159 lb (72.1 kg)   SpO2 94%   BMI 22.18 kg/m   BP/Weight 04/29/2018 04/09/2018 04/01/2018  Systolic BP 121 148 149  Diastolic BP 88 88 99  Wt. (Lbs) 159 - 155.6  BMI 22.18 - 21.7      Physical Exam  Constitutional: He is oriented to person, place, and time.  Well developed, well nourished, NAD, polite  HENT:  Head: Normocephalic and atraumatic.  Eyes: No scleral icterus.  Neck: Normal range of motion. Neck supple. No thyromegaly present.  Cardiovascular: Normal rate, regular rhythm and normal heart sounds.  Pulmonary/Chest: Effort normal and breath sounds normal.  Musculoskeletal:  He exhibits no edema.  Neurological: He is alert and oriented to person, place, and time.  Skin: Skin is warm and dry. No rash noted. No erythema. No pallor.  Psychiatric: He has a normal mood and affect. His behavior is normal. Thought content normal.  Vitals reviewed.    Assessment & Plan:    1. Essential hypertension - Well controlled. Continue Prinzide.   2. Severe major depression with psychotic features (HCC) - Ambulatory referral to  Psychiatry - Continue Seroquel - Stop Sertraline due to severe diarrhea.   3. Screening for colon cancer - Fecal occult blood, imunochemical  4. Hematuria, unspecified type - PSA - Urinalysis Dipstick with small blood which is reduced from moderate blood. Will await PSA results.   5. Urinary dribbling - PSA - Urinalysis Dipstick with small blood which is reduced from moderate blood. Will await PSA results.   Follow-up: Return in about 3 months (around 07/30/2018) for depression.   Loletta Specter PA

## 2018-04-29 NOTE — Patient Instructions (Signed)
DASH Eating Plan DASH stands for "Dietary Approaches to Stop Hypertension." The DASH eating plan is a healthy eating plan that has been shown to reduce high blood pressure (hypertension). It may also reduce your risk for type 2 diabetes, heart disease, and stroke. The DASH eating plan may also help with weight loss. What are tips for following this plan? General guidelines  Avoid eating more than 2,300 mg (milligrams) of salt (sodium) a day. If you have hypertension, you may need to reduce your sodium intake to 1,500 mg a day.  Limit alcohol intake to no more than 1 drink a day for nonpregnant women and 2 drinks a day for men. One drink equals 12 oz of beer, 5 oz of wine, or 1 oz of hard liquor.  Work with your health care provider to maintain a healthy body weight or to lose weight. Ask what an ideal weight is for you.  Get at least 30 minutes of exercise that causes your heart to beat faster (aerobic exercise) most days of the week. Activities may include walking, swimming, or biking.  Work with your health care provider or diet and nutrition specialist (dietitian) to adjust your eating plan to your individual calorie needs. Reading food labels  Check food labels for the amount of sodium per serving. Choose foods with less than 5 percent of the Daily Value of sodium. Generally, foods with less than 300 mg of sodium per serving fit into this eating plan.  To find whole grains, look for the word "whole" as the first word in the ingredient list. Shopping  Buy products labeled as "low-sodium" or "no salt added."  Buy fresh foods. Avoid canned foods and premade or frozen meals. Cooking  Avoid adding salt when cooking. Use salt-free seasonings or herbs instead of table salt or sea salt. Check with your health care provider or pharmacist before using salt substitutes.  Do not fry foods. Cook foods using healthy methods such as baking, boiling, grilling, and broiling instead.  Cook with  heart-healthy oils, such as olive, canola, soybean, or sunflower oil. Meal planning   Eat a balanced diet that includes: ? 5 or more servings of fruits and vegetables each day. At each meal, try to fill half of your plate with fruits and vegetables. ? Up to 6-8 servings of whole grains each day. ? Less than 6 oz of lean meat, poultry, or fish each day. A 3-oz serving of meat is about the same size as a deck of cards. One egg equals 1 oz. ? 2 servings of low-fat dairy each day. ? A serving of nuts, seeds, or beans 5 times each week. ? Heart-healthy fats. Healthy fats called Omega-3 fatty acids are found in foods such as flaxseeds and coldwater fish, like sardines, salmon, and mackerel.  Limit how much you eat of the following: ? Canned or prepackaged foods. ? Food that is high in trans fat, such as fried foods. ? Food that is high in saturated fat, such as fatty meat. ? Sweets, desserts, sugary drinks, and other foods with added sugar. ? Full-fat dairy products.  Do not salt foods before eating.  Try to eat at least 2 vegetarian meals each week.  Eat more home-cooked food and less restaurant, buffet, and fast food.  When eating at a restaurant, ask that your food be prepared with less salt or no salt, if possible. What foods are recommended? The items listed may not be a complete list. Talk with your dietitian about what   dietary choices are best for you. Grains Whole-grain or whole-wheat bread. Whole-grain or whole-wheat pasta. Brown rice. Oatmeal. Quinoa. Bulgur. Whole-grain and low-sodium cereals. Pita bread. Low-fat, low-sodium crackers. Whole-wheat flour tortillas. Vegetables Fresh or frozen vegetables (raw, steamed, roasted, or grilled). Low-sodium or reduced-sodium tomato and vegetable juice. Low-sodium or reduced-sodium tomato sauce and tomato paste. Low-sodium or reduced-sodium canned vegetables. Fruits All fresh, dried, or frozen fruit. Canned fruit in natural juice (without  added sugar). Meat and other protein foods Skinless chicken or turkey. Ground chicken or turkey. Pork with fat trimmed off. Fish and seafood. Egg whites. Dried beans, peas, or lentils. Unsalted nuts, nut butters, and seeds. Unsalted canned beans. Lean cuts of beef with fat trimmed off. Low-sodium, lean deli meat. Dairy Low-fat (1%) or fat-free (skim) milk. Fat-free, low-fat, or reduced-fat cheeses. Nonfat, low-sodium ricotta or cottage cheese. Low-fat or nonfat yogurt. Low-fat, low-sodium cheese. Fats and oils Soft margarine without trans fats. Vegetable oil. Low-fat, reduced-fat, or light mayonnaise and salad dressings (reduced-sodium). Canola, safflower, olive, soybean, and sunflower oils. Avocado. Seasoning and other foods Herbs. Spices. Seasoning mixes without salt. Unsalted popcorn and pretzels. Fat-free sweets. What foods are not recommended? The items listed may not be a complete list. Talk with your dietitian about what dietary choices are best for you. Grains Baked goods made with fat, such as croissants, muffins, or some breads. Dry pasta or rice meal packs. Vegetables Creamed or fried vegetables. Vegetables in a cheese sauce. Regular canned vegetables (not low-sodium or reduced-sodium). Regular canned tomato sauce and paste (not low-sodium or reduced-sodium). Regular tomato and vegetable juice (not low-sodium or reduced-sodium). Pickles. Olives. Fruits Canned fruit in a light or heavy syrup. Fried fruit. Fruit in cream or butter sauce. Meat and other protein foods Fatty cuts of meat. Ribs. Fried meat. Bacon. Sausage. Bologna and other processed lunch meats. Salami. Fatback. Hotdogs. Bratwurst. Salted nuts and seeds. Canned beans with added salt. Canned or smoked fish. Whole eggs or egg yolks. Chicken or turkey with skin. Dairy Whole or 2% milk, cream, and half-and-half. Whole or full-fat cream cheese. Whole-fat or sweetened yogurt. Full-fat cheese. Nondairy creamers. Whipped toppings.  Processed cheese and cheese spreads. Fats and oils Butter. Stick margarine. Lard. Shortening. Ghee. Bacon fat. Tropical oils, such as coconut, palm kernel, or palm oil. Seasoning and other foods Salted popcorn and pretzels. Onion salt, garlic salt, seasoned salt, table salt, and sea salt. Worcestershire sauce. Tartar sauce. Barbecue sauce. Teriyaki sauce. Soy sauce, including reduced-sodium. Steak sauce. Canned and packaged gravies. Fish sauce. Oyster sauce. Cocktail sauce. Horseradish that you find on the shelf. Ketchup. Mustard. Meat flavorings and tenderizers. Bouillon cubes. Hot sauce and Tabasco sauce. Premade or packaged marinades. Premade or packaged taco seasonings. Relishes. Regular salad dressings. Where to find more information:  National Heart, Lung, and Blood Institute: www.nhlbi.nih.gov  American Heart Association: www.heart.org Summary  The DASH eating plan is a healthy eating plan that has been shown to reduce high blood pressure (hypertension). It may also reduce your risk for type 2 diabetes, heart disease, and stroke.  With the DASH eating plan, you should limit salt (sodium) intake to 2,300 mg a day. If you have hypertension, you may need to reduce your sodium intake to 1,500 mg a day.  When on the DASH eating plan, aim to eat more fresh fruits and vegetables, whole grains, lean proteins, low-fat dairy, and heart-healthy fats.  Work with your health care provider or diet and nutrition specialist (dietitian) to adjust your eating plan to your individual   calorie needs. This information is not intended to replace advice given to you by your health care provider. Make sure you discuss any questions you have with your health care provider. Document Released: 06/14/2011 Document Revised: 06/18/2016 Document Reviewed: 06/18/2016 Elsevier Interactive Patient Education  2018 Elsevier Inc.  

## 2018-04-30 LAB — PSA: Prostate Specific Ag, Serum: 1.6 ng/mL (ref 0.0–4.0)

## 2018-05-01 ENCOUNTER — Telehealth (INDEPENDENT_AMBULATORY_CARE_PROVIDER_SITE_OTHER): Payer: Self-pay

## 2018-05-01 NOTE — Telephone Encounter (Signed)
Patient is aware that PSA is within normal limits. Kenneth Andersen, CMA

## 2018-05-15 ENCOUNTER — Other Ambulatory Visit (INDEPENDENT_AMBULATORY_CARE_PROVIDER_SITE_OTHER): Payer: Self-pay | Admitting: Physician Assistant

## 2018-05-15 DIAGNOSIS — M545 Low back pain, unspecified: Secondary | ICD-10-CM

## 2018-05-15 DIAGNOSIS — G8929 Other chronic pain: Secondary | ICD-10-CM

## 2018-05-15 NOTE — Telephone Encounter (Signed)
FWD to PCP. Tempestt S Roberts, CMA  

## 2018-07-07 ENCOUNTER — Other Ambulatory Visit (INDEPENDENT_AMBULATORY_CARE_PROVIDER_SITE_OTHER): Payer: Self-pay | Admitting: Physician Assistant

## 2018-07-07 DIAGNOSIS — F323 Major depressive disorder, single episode, severe with psychotic features: Secondary | ICD-10-CM

## 2018-07-07 NOTE — Telephone Encounter (Signed)
FWD to PCP. Tempestt S Roberts, CMA  

## 2018-07-29 ENCOUNTER — Ambulatory Visit (INDEPENDENT_AMBULATORY_CARE_PROVIDER_SITE_OTHER): Payer: Medicaid Other | Admitting: Family Medicine

## 2018-08-19 ENCOUNTER — Ambulatory Visit (INDEPENDENT_AMBULATORY_CARE_PROVIDER_SITE_OTHER): Payer: Medicaid Other | Admitting: Primary Care

## 2018-09-08 ENCOUNTER — Ambulatory Visit (INDEPENDENT_AMBULATORY_CARE_PROVIDER_SITE_OTHER): Payer: Medicaid Other | Admitting: Primary Care

## 2018-09-19 ENCOUNTER — Ambulatory Visit (INDEPENDENT_AMBULATORY_CARE_PROVIDER_SITE_OTHER): Payer: Medicaid Other | Admitting: Primary Care

## 2018-09-25 ENCOUNTER — Ambulatory Visit (INDEPENDENT_AMBULATORY_CARE_PROVIDER_SITE_OTHER): Payer: Medicaid Other | Admitting: Primary Care

## 2018-12-13 ENCOUNTER — Encounter (HOSPITAL_COMMUNITY): Payer: Self-pay | Admitting: Emergency Medicine

## 2018-12-13 ENCOUNTER — Emergency Department (HOSPITAL_COMMUNITY)
Admission: EM | Admit: 2018-12-13 | Discharge: 2018-12-13 | Disposition: A | Discharge status: Home / Self Care | Payer: Medicaid Other | Attending: Emergency Medicine | Admitting: Emergency Medicine

## 2018-12-13 ENCOUNTER — Other Ambulatory Visit: Payer: Self-pay

## 2018-12-13 DIAGNOSIS — F17210 Nicotine dependence, cigarettes, uncomplicated: Secondary | ICD-10-CM | POA: Diagnosis not present

## 2018-12-13 DIAGNOSIS — K921 Melena: Secondary | ICD-10-CM | POA: Diagnosis not present

## 2018-12-13 DIAGNOSIS — I10 Essential (primary) hypertension: Secondary | ICD-10-CM | POA: Insufficient documentation

## 2018-12-13 DIAGNOSIS — Z79899 Other long term (current) drug therapy: Secondary | ICD-10-CM | POA: Diagnosis not present

## 2018-12-13 LAB — CBC WITH DIFFERENTIAL/PLATELET
Abs Immature Granulocytes: 0.03 10*3/uL (ref 0.00–0.07)
Basophils Absolute: 0.1 10*3/uL (ref 0.0–0.1)
Basophils Relative: 1 %
Eosinophils Absolute: 0.2 10*3/uL (ref 0.0–0.5)
Eosinophils Relative: 2 %
HCT: 37.2 % — ABNORMAL LOW (ref 39.0–52.0)
Hemoglobin: 12.3 g/dL — ABNORMAL LOW (ref 13.0–17.0)
Immature Granulocytes: 0 %
Lymphocytes Relative: 38 %
Lymphs Abs: 3.2 10*3/uL (ref 0.7–4.0)
MCH: 30.8 pg (ref 26.0–34.0)
MCHC: 33.1 g/dL (ref 30.0–36.0)
MCV: 93.2 fL (ref 80.0–100.0)
Monocytes Absolute: 1.1 10*3/uL — ABNORMAL HIGH (ref 0.1–1.0)
Monocytes Relative: 12 %
Neutro Abs: 3.9 10*3/uL (ref 1.7–7.7)
Neutrophils Relative %: 47 %
Platelets: 289 10*3/uL (ref 150–400)
RBC: 3.99 MIL/uL — ABNORMAL LOW (ref 4.22–5.81)
RDW: 12.8 % (ref 11.5–15.5)
WBC: 8.5 10*3/uL (ref 4.0–10.5)
nRBC: 0 % (ref 0.0–0.2)

## 2018-12-13 LAB — COMPREHENSIVE METABOLIC PANEL
ALT: 13 U/L (ref 0–44)
AST: 20 U/L (ref 15–41)
Albumin: 3.6 g/dL (ref 3.5–5.0)
Alkaline Phosphatase: 54 U/L (ref 38–126)
Anion gap: 5 (ref 5–15)
BUN: 30 mg/dL — ABNORMAL HIGH (ref 8–23)
CO2: 29 mmol/L (ref 22–32)
Calcium: 9.1 mg/dL (ref 8.9–10.3)
Chloride: 111 mmol/L (ref 98–111)
Creatinine, Ser: 1.07 mg/dL (ref 0.61–1.24)
GFR calc Af Amer: >60 mL/min (ref >60)
GFR calc non Af Amer: >60 mL/min (ref >60)
Glucose, Bld: 78 mg/dL (ref 70–99)
Potassium: 5.2 mmol/L — ABNORMAL HIGH (ref 3.5–5.1)
Sodium: 145 mmol/L (ref 135–145)
Total Bilirubin: 0.3 mg/dL (ref 0.3–1.2)
Total Protein: 5.9 g/dL — ABNORMAL LOW (ref 6.5–8.1)

## 2018-12-13 LAB — URINALYSIS, ROUTINE W REFLEX MICROSCOPIC
Bilirubin Urine: NEGATIVE
Glucose, UA: NEGATIVE mg/dL
Hgb urine dipstick: NEGATIVE
Ketones, ur: NEGATIVE mg/dL
Leukocytes,Ua: NEGATIVE
Nitrite: NEGATIVE
Protein, ur: NEGATIVE mg/dL
Specific Gravity, Urine: 1.021 (ref 1.005–1.030)
pH: 7 (ref 5.0–8.0)

## 2018-12-13 LAB — LIPASE, BLOOD: Lipase: 25 U/L (ref 11–51)

## 2018-12-13 LAB — POC OCCULT BLOOD, ED: Fecal Occult Bld: POSITIVE — AB

## 2018-12-13 MED ORDER — SODIUM ZIRCONIUM CYCLOSILICATE 5 G PO PACK
5.0000 g | PACK | Freq: Once | ORAL | Status: AC
  Administered 2018-12-13: 19:00:00 5 g via ORAL
  Filled 2018-12-13: qty 1

## 2018-12-13 MED ORDER — SUCRALFATE 1 G PO TABS: 1.0000 g | ORAL_TABLET | Freq: Three times a day (TID) | ORAL | 0 refills | Status: AC

## 2018-12-13 MED ORDER — PANTOPRAZOLE SODIUM 40 MG IV SOLR
40.0000 mg | Freq: Once | INTRAVENOUS | Status: AC
  Administered 2018-12-13: 40 mg via INTRAVENOUS
  Filled 2018-12-13: qty 40

## 2018-12-13 MED ORDER — OMEPRAZOLE 20 MG PO CPDR: 20.0000 mg | DELAYED_RELEASE_CAPSULE | Freq: Every day | ORAL | 0 refills | Status: AC

## 2018-12-13 MED ORDER — SODIUM CHLORIDE 0.9 % IV BOLUS
1000.0000 mL | Freq: Once | INTRAVENOUS | Status: AC
  Administered 2018-12-13: 1000 mL via INTRAVENOUS

## 2018-12-13 NOTE — ED Notes (Signed)
Patient verbalizes understanding of discharge instructions. Opportunity for questioning and answers were provided. Armband removed by staff, pt discharged from ED by wheelchair with wife to pick up

## 2018-12-13 NOTE — ED Triage Notes (Signed)
Pt constipated x 4 days. Had dark color BM last night. BM this morning pt noted stool to be jet black and blood in toilet. Pt also complaining of mid abdominal pain

## 2018-12-13 NOTE — Discharge Instructions (Signed)
Please avoid alcohol use as it can irritate your stomach causing bleeding.  Please call and follow-up with GI specialist for further evaluation of your rectal bleeding.  You may benefit from a colonoscopy.  Take medication prescribed.  Return if you have any concern.

## 2018-12-13 NOTE — ED Provider Notes (Signed)
[facility] EMERGENCY DEPARTMENT Provider Note   CSN: [id number] Arrival date & time: 12/13/18  1610    History   Chief Complaint Chief Complaint  Patient presents with  . Blood In Stools    HPI Kenneth Andersen is a 63 y.o. male.     The history is provided by the patient and medical records. No language interpreter was used.     63 year old male with history of hepatitis C, hypertension presenting for evaluation of blood per rectum.  Patient report for the past 4 days he has been drinking quite a bit of alcohol and not eating that much.  He also endorsed feeling, constipated for the same duration.  Yesterday had a hard bowel movement follows with dark-colored stools and also noticed blood in the toilet bowl.  He took a laxative and today his bowel movement has improved but is still dull and black.  He did endorse nausea earlier with one episode of nonbloody nonbilious bilious vomiting.  He does endorse mild epigastric discomfort.  He endorsed feeling a bit weak and lightheadedness.  He denies having pain in his chest shortness of breath productive cough dysuria hematuria.  He denies any NSAID use.  He is not on any blood thinner medication.  Past Medical History:  Diagnosis Date  . Back pain   . Hypertension     Patient Active Problem List   Diagnosis Date Noted  . Chronic hepatitis C without hepatic coma ([place]) 12/24/2016    Past Surgical History:  Procedure Laterality Date  . TENDON EXPLORATION Left 05/31/2014   Procedure: ORIF LEFT THUMB WITH NAIL BED REPAIR;  Surgeon: Charlotte Crumb, MD;  Location: [facility];  Service: Orthopedics;  Laterality: Left;        Home Medications    Prior to Admission medications   Medication Sig Start Date End Date Taking? Authorizing Provider  albuterol (PROVENTIL HFA;VENTOLIN HFA) 108 (90 Base) MCG/ACT inhaler Inhale 1-2 puffs into the lungs every 6 (six) hours as needed for wheezing or shortness of breath. 04/01/18    Clent Demark, PA-C  atorvastatin (LIPITOR) 20 MG tablet Take 1 tablet (20 mg total) by mouth daily. 04/03/18   Clent Demark, PA-C  gabapentin (NEURONTIN) 300 MG capsule Take 2 capsules (600 mg total) by mouth 3 (three) times daily. 04/01/18   Clent Demark, PA-C  lisinopril-hydrochlorothiazide (PRINZIDE,ZESTORETIC) 20-12.5 MG tablet Take 2 tablets by mouth daily. 04/01/18   Clent Demark, PA-C  methocarbamol (ROBAXIN) 500 MG tablet Take 1 tablet (500 mg total) by mouth every 8 (eight) hours as needed for muscle spasms. 04/01/18   Clent Demark, PA-C  naproxen (NAPROSYN) 500 MG tablet Take 1 tablet (500 mg total) by mouth 2 (two) times daily as needed. 04/01/18   Clent Demark, PA-C  QUEtiapine (SEROQUEL) 25 MG tablet TAKE ONE TABLET BY MOUTH AT BEDTIME 07/08/18   Clent Demark, PA-C  tiotropium [facility]) 18 MCG inhalation capsule Place 1 capsule (18 mcg total) into inhaler and inhale daily. 04/01/18   Clent Demark, PA-C    Family History No family history on file.  Social History Social History   Tobacco Use  . Smoking status: Current Every Day Smoker    Types: Cigarettes  . Smokeless tobacco: Never Used  Substance Use Topics  . Alcohol use: Yes    Comment: "occasionally"  . Drug use: No     Allergies   Patient has no known allergies.   Review of Systems  Review of Systems  All other systems reviewed and are negative.    Physical Exam Updated Vital Signs BP (!) 147/79   Pulse (!) 101   Temp (!) 97.2 F (36.2 C) (Oral)   Resp 18   SpO2 100%   Physical Exam Vitals signs and nursing note reviewed. Exam conducted with a chaperone present.  Constitutional:      General: He is not in acute distress.    Appearance: He is well-developed.  HENT:     Head: Atraumatic.  Eyes:     Conjunctiva/sclera: Conjunctivae normal.  Neck:     Musculoskeletal: Neck supple.  Cardiovascular:     Rate and Rhythm: Tachycardia present.     Pulses:  Normal pulses.     Heart sounds: Normal heart sounds.  Pulmonary:     Effort: Pulmonary effort is normal.     Breath sounds: Normal breath sounds.  Abdominal:     General: Abdomen is flat.     Palpations: Abdomen is soft.     Tenderness: There is no abdominal tenderness.  Genitourinary:    Comments: Normal rectal tone, no thrombosed hemorrhoid, maroon color stool on glove.  No obvious rectal mass. Skin:    Findings: No rash.  Neurological:     Mental Status: He is alert and oriented to person, place, and time.      ED Treatments / Results  Labs (all labs ordered are listed, but only abnormal results are displayed) Labs Reviewed  CBC WITH DIFFERENTIAL/PLATELET - Abnormal; Notable for the following components:      Result Value   RBC 3.99 (*)    Hemoglobin 12.3 (*)    HCT 37.2 (*)    Monocytes Absolute 1.1 (*)    All other components within normal limits  COMPREHENSIVE METABOLIC PANEL - Abnormal; Notable for the following components:   Potassium 5.2 (*)    BUN 30 (*)    Total Protein 5.9 (*)    All other components within normal limits  POC OCCULT BLOOD, ED - Abnormal; Notable for the following components:   Fecal Occult Bld POSITIVE (*)    All other components within normal limits  LIPASE, BLOOD  URINALYSIS, ROUTINE W REFLEX MICROSCOPIC    EKG None  Radiology No results found.  Procedures Procedures (including critical care time)  Medications Ordered in ED Medications  sodium chloride 0.9 % bolus 1,000 mL (0 mLs Intravenous Stopped 12/13/18 1846)  sodium zirconium cyclosilicate (LOKELMA) packet 5 g (5 g Oral Given 12/13/18 1901)  pantoprazole (PROTONIX) injection 40 mg (40 mg Intravenous Given 12/13/18 1839)     Initial Impression / Assessment and Plan / ED Course  I have reviewed the triage vital signs and the nursing notes.  Pertinent labs & imaging results that were available during my care of the patient were reviewed by me and considered in my medical  decision making (see chart for details).        BP 133/82   Pulse 95   Temp (!) 97.2 F (36.2 C) (Oral)   Resp 18   SpO2 100%    Final Clinical Impressions(s) / ED Diagnoses   Final diagnoses:  Blood in stool    ED Discharge Orders         Ordered    sucralfate (CARAFATE) 1 g tablet  3 times daily with meals & bedtime     12/13/18 1928    omeprazole (PRILOSEC) 20 MG capsule  Daily     12/13/18 1928  Patient with history of alcohol abuse, presents with epigastric pain as well as blood per rectum.  On exam he has minimal abdominal discomfort.  He does have maroon color stool on glove.  He is mildly tachycardic, IV fluid given.  Suspect GI bleed secondary to gastritis irritated by alcohol use.  6:25 PM Patient initially tachycardic, given IV fluid, tachycardia resolved.  Blood pressure is stable.  Hemoglobin is 12.3.  Fecal occult blood test is positive.  Elevated potassium of 5.2 therefore Lokelma given to help reduce potassium level.  Normal lipase.  Patient currently resting comfortably.  Patient given Protonix for GI bleed.  We will monitor closely.  Anticipate discharge with outpatient follow-up with GI for further evaluation of his rectal bleeding.  Patient voiced understanding and agrees with plan.   Fayrene Helperran, Million Maharaj, PA-C 12/13/18 1931    Benjiman CorePickering, Nathan, MD 12/23/18 1459

## 2018-12-22 ENCOUNTER — Inpatient Hospital Stay (INDEPENDENT_AMBULATORY_CARE_PROVIDER_SITE_OTHER): Payer: Medicaid Other | Admitting: Primary Care

## 2020-01-05 ENCOUNTER — Emergency Department (HOSPITAL_COMMUNITY): Payer: Medicaid Other

## 2020-01-05 ENCOUNTER — Encounter (HOSPITAL_COMMUNITY): Payer: Self-pay | Admitting: *Deleted

## 2020-01-05 ENCOUNTER — Other Ambulatory Visit: Payer: Self-pay

## 2020-01-05 ENCOUNTER — Emergency Department (HOSPITAL_COMMUNITY)
Admission: EM | Admit: 2020-01-05 | Discharge: 2020-01-05 | Disposition: A | Discharge status: Home / Self Care | Payer: Medicaid Other | Attending: Emergency Medicine | Admitting: Emergency Medicine

## 2020-01-05 DIAGNOSIS — I10 Essential (primary) hypertension: Secondary | ICD-10-CM | POA: Diagnosis not present

## 2020-01-05 DIAGNOSIS — F17210 Nicotine dependence, cigarettes, uncomplicated: Secondary | ICD-10-CM | POA: Diagnosis not present

## 2020-01-05 DIAGNOSIS — K2970 Gastritis, unspecified, without bleeding: Secondary | ICD-10-CM

## 2020-01-05 DIAGNOSIS — Z79899 Other long term (current) drug therapy: Secondary | ICD-10-CM | POA: Diagnosis not present

## 2020-01-05 DIAGNOSIS — R1013 Epigastric pain: Secondary | ICD-10-CM | POA: Diagnosis not present

## 2020-01-05 DIAGNOSIS — R079 Chest pain, unspecified: Secondary | ICD-10-CM | POA: Diagnosis present

## 2020-01-05 LAB — LIPASE, BLOOD: Lipase: 23 U/L (ref 11–51)

## 2020-01-05 LAB — HEPATIC FUNCTION PANEL
ALT: 11 U/L (ref 0–44)
AST: 19 U/L (ref 15–41)
Albumin: 4.3 g/dL (ref 3.5–5.0)
Alkaline Phosphatase: 69 U/L (ref 38–126)
Bilirubin, Direct: 0.1 mg/dL (ref 0.0–0.2)
Indirect Bilirubin: 0.6 mg/dL (ref 0.3–0.9)
Total Bilirubin: 0.7 mg/dL (ref 0.3–1.2)
Total Protein: 7 g/dL (ref 6.5–8.1)

## 2020-01-05 LAB — CBC
HCT: 52.5 % — ABNORMAL HIGH (ref 39.0–52.0)
Hemoglobin: 16.9 g/dL (ref 13.0–17.0)
MCH: 30.4 pg (ref 26.0–34.0)
MCHC: 32.2 g/dL (ref 30.0–36.0)
MCV: 94.4 fL (ref 80.0–100.0)
Platelets: 310 10*3/uL (ref 150–400)
RBC: 5.56 MIL/uL (ref 4.22–5.81)
RDW: 12.4 % (ref 11.5–15.5)
WBC: 6 10*3/uL (ref 4.0–10.5)
nRBC: 0 % (ref 0.0–0.2)

## 2020-01-05 LAB — BASIC METABOLIC PANEL
Anion gap: 12 (ref 5–15)
BUN: 6 mg/dL — ABNORMAL LOW (ref 8–23)
CO2: 22 mmol/L (ref 22–32)
Calcium: 10.3 mg/dL (ref 8.9–10.3)
Chloride: 103 mmol/L (ref 98–111)
Creatinine, Ser: 0.96 mg/dL (ref 0.61–1.24)
GFR calc Af Amer: >60 mL/min (ref >60)
GFR calc non Af Amer: >60 mL/min (ref >60)
Glucose, Bld: 99 mg/dL (ref 70–99)
Potassium: 4.2 mmol/L (ref 3.5–5.1)
Sodium: 137 mmol/L (ref 135–145)

## 2020-01-05 LAB — TROPONIN I (HIGH SENSITIVITY)
Troponin I (High Sensitivity): 4 ng/L (ref <18)
Troponin I (High Sensitivity): 4 ng/L (ref <18)

## 2020-01-05 MED ORDER — LIDOCAINE VISCOUS HCL 2 % MT SOLN
15.0000 mL | Freq: Once | OROMUCOSAL | Status: AC
  Administered 2020-01-05: 15 mL via ORAL
  Filled 2020-01-05: qty 15

## 2020-01-05 MED ORDER — ESOMEPRAZOLE MAGNESIUM 40 MG PO CPDR: 40.0000 mg | DELAYED_RELEASE_CAPSULE | Freq: Every day | ORAL | 0 refills | Status: AC

## 2020-01-05 MED ORDER — SODIUM CHLORIDE 0.9 % IV BOLUS
1000.0000 mL | Freq: Once | INTRAVENOUS | Status: AC
  Administered 2020-01-05: 1000 mL via INTRAVENOUS

## 2020-01-05 MED ORDER — SODIUM CHLORIDE 0.9% FLUSH: 3.0000 mL | Freq: Once | INTRAVENOUS | Status: DC

## 2020-01-05 MED ORDER — ALUM & MAG HYDROXIDE-SIMETH 200-200-20 MG/5ML PO SUSP
30.0000 mL | Freq: Once | ORAL | Status: AC
  Administered 2020-01-05: 30 mL via ORAL
  Filled 2020-01-05: qty 30

## 2020-01-05 MED ORDER — PANTOPRAZOLE SODIUM 40 MG PO TBEC
40.0000 mg | DELAYED_RELEASE_TABLET | Freq: Once | ORAL | Status: AC
  Administered 2020-01-05: 40 mg via ORAL
  Filled 2020-01-05: qty 1

## 2020-01-05 MED ORDER — IOHEXOL 300 MG/ML  SOLN
100.0000 mL | Freq: Once | INTRAMUSCULAR | Status: AC | PRN
  Administered 2020-01-05: 100 mL via INTRAVENOUS

## 2020-01-05 NOTE — ED Notes (Signed)
Patient verbalizes understanding of discharge instructions. Opportunity for questioning and answers were provided. Armband removed by staff, pt discharged from ED ambulatory.   

## 2020-01-05 NOTE — Discharge Instructions (Signed)
Take nexium daily   Avoid drinking alcohol, stay hydrated   See GI for follow up for endoscopy if you have persistent pain   See your doctor   Return to ER if you have worse chest pain, abdominal pain, vomiting.

## 2020-01-05 NOTE — ED Provider Notes (Signed)
  Physical Exam  BP (!) 153/96   Pulse 81   Temp 98.6 F (37 C) (Oral)   Resp 19   Ht 6' (1.829 m)   Wt 73.5 kg   SpO2 99%   BMI 21.97 kg/m   Physical Exam  ED Course/Procedures     Procedures  MDM  Care assumed at 3:30 pm.  Patient has some epigastric pain after drinking some alcohol.  Patient had negative troponin x2 and normal LFTs.  Signout pending CT abdomen pelvis.  4:51 PM CT unremarkable. Given protonix. Plan to dc home with protonix, GI follow up.      Charlynne Pander, MD 01/05/20 [phone]

## 2020-01-05 NOTE — ED Triage Notes (Addendum)
To ED for eval of epigastric pain starting 3 days ago. No nausea or vomiting. No diarrhea. No radiation of epigastric pain. Pt states last bm 3 days ago. Has felt this way before and told he was constipated.

## 2020-01-05 NOTE — ED Provider Notes (Signed)
[facility] EMERGENCY DEPARTMENT Provider Note   CSN: [id number] Arrival date & time: 01/05/20  1884     History Chief Complaint  Patient presents with  . Chest Pain    Kenneth Andersen is a 64 y.o. male.  Patient is a 64 year old male with history of hypertension, hepatitis C, and gastritis related to alcohol.  He presents today for evaluation of epigastric pain.  This has been present for the past 3 days and is worsening.  It is worse when he attempts to eat or drink.  Patient tells me he has significantly decreased his alcohol intake recently and only drinks 1 beer per day.  He has been on antacids in the past, however has not been on anything in quite some time.  He denies any fevers or chills.  He denies any bowel or bladder complaints.  He denies to me he is experiencing any chest pain or shortness of breath.  The history is provided by the patient.       Past Medical History:  Diagnosis Date  . Back pain   . Hypertension     Patient Active Problem List   Diagnosis Date Noted  . Chronic hepatitis C without hepatic coma ([facility]) 12/24/2016    Past Surgical History:  Procedure Laterality Date  . TENDON EXPLORATION Left 05/31/2014   Procedure: ORIF LEFT THUMB WITH NAIL BED REPAIR;  Surgeon: Dairl Ponder, MD;  Location: [facility] OR;  Service: Orthopedics;  Laterality: Left;       No family history on file.  Social History   Tobacco Use  . Smoking status: Current Every Day Smoker    Types: Cigarettes  . Smokeless tobacco: Never Used  Substance Use Topics  . Alcohol use: Yes    Comment: "occasionally"  . Drug use: No    Home Medications Prior to Admission medications   Medication Sig Start Date End Date Taking? Authorizing Provider  albuterol (PROVENTIL HFA;VENTOLIN HFA) 108 (90 Base) MCG/ACT inhaler Inhale 1-2 puffs into the lungs every 6 (six) hours as needed for wheezing or shortness of breath. 04/01/18   Loletta Specter, PA-C  atorvastatin  (LIPITOR) 20 MG tablet Take 1 tablet (20 mg total) by mouth daily. 04/03/18   Loletta Specter, PA-C  gabapentin (NEURONTIN) 300 MG capsule Take 2 capsules (600 mg total) by mouth 3 (three) times daily. 04/01/18   Loletta Specter, PA-C  lisinopril-hydrochlorothiazide (PRINZIDE,ZESTORETIC) 20-12.5 MG tablet Take 2 tablets by mouth daily. 04/01/18   Loletta Specter, PA-C  methocarbamol (ROBAXIN) 500 MG tablet Take 1 tablet (500 mg total) by mouth every 8 (eight) hours as needed for muscle spasms. 04/01/18   Loletta Specter, PA-C  omeprazole (PRILOSEC) 20 MG capsule Take 1 capsule (20 mg total) by mouth daily. 12/13/18   Fayrene Helper, PA-C  QUEtiapine (SEROQUEL) 25 MG tablet TAKE ONE TABLET BY MOUTH AT BEDTIME 07/08/18   Loletta Specter, PA-C  sucralfate (CARAFATE) 1 g tablet Take 1 tablet (1 g total) by mouth 4 (four) times daily -  with meals and at bedtime. 12/13/18   Fayrene Helper, PA-C  tiotropium (SPIRIVA) 18 MCG inhalation capsule Place 1 capsule (18 mcg total) into inhaler and inhale daily. 04/01/18   Loletta Specter, PA-C    Allergies    Patient has no known allergies.  Review of Systems   Review of Systems  All other systems reviewed and are negative.   Physical Exam Updated Vital Signs BP (!) 151/97 (BP Location:  Right Arm)   Pulse 81   Temp 98.6 F (37 C) (Oral)   Resp 18   Ht 6' (1.829 m)   Wt 73.5 kg   SpO2 99%   BMI 21.97 kg/m   Physical Exam Vitals and nursing note reviewed.  Constitutional:      General: He is not in acute distress.    Appearance: He is well-developed. He is not diaphoretic.  HENT:     Head: Normocephalic and atraumatic.  Cardiovascular:     Rate and Rhythm: Normal rate and regular rhythm.     Heart sounds: No murmur heard.  No friction rub.  Pulmonary:     Effort: Pulmonary effort is normal. No respiratory distress.     Breath sounds: Normal breath sounds. No wheezing or rales.  Abdominal:     General: Bowel sounds are normal. There  is no distension.     Palpations: Abdomen is soft.     Tenderness: There is abdominal tenderness. There is no guarding or rebound.     Comments: There is tenderness to palpation in the epigastric region.  There is no rebound or guarding.  Musculoskeletal:        General: Normal range of motion.     Cervical back: Normal range of motion and neck supple.  Skin:    General: Skin is warm and dry.  Neurological:     Mental Status: He is alert and oriented to person, place, and time.     Coordination: Coordination normal.     ED Results / Procedures / Treatments   Labs (all labs ordered are listed, but only abnormal results are displayed) Labs Reviewed  BASIC METABOLIC PANEL - Abnormal; Notable for the following components:      Result Value   BUN 6 (*)    All other components within normal limits  CBC - Abnormal; Notable for the following components:   HCT 52.5 (*)    All other components within normal limits  LIPASE, BLOOD  HEPATIC FUNCTION PANEL  TROPONIN I (HIGH SENSITIVITY)  TROPONIN I (HIGH SENSITIVITY)    EKG EKG Interpretation  Date/Time:  Tuesday January 05 2020 07:41:13 EDT Ventricular Rate:  79 PR Interval:  148 QRS Duration: 88 QT Interval:  360 QTC Calculation: 412 R Axis:   79 Text Interpretation: Normal sinus rhythm Right atrial enlargement Minimal voltage criteria for LVH, may be normal variant Borderline ECG Confirmed by Geoffery Lyons ([id number]) on 01/05/2020 1:09:17 PM   Radiology No results found.  Procedures Procedures (including critical care time)  Medications Ordered in ED Medications  sodium chloride flush (NS) 0.9 % injection 3 mL (has no administration in time range)  alum & mag hydroxide-simeth (MAALOX/MYLANTA) 200-200-20 MG/5ML suspension 30 mL (has no administration in time range)    And  lidocaine (XYLOCAINE) 2 % viscous mouth solution 15 mL (has no administration in time range)  sodium chloride 0.9 % bolus 1,000 mL (has no administration in  time range)    ED Course  I have reviewed the triage vital signs and the nursing notes.  Pertinent labs & imaging results that were available during my care of the patient were reviewed by me and considered in my medical decision making (see chart for details).    MDM Rules/Calculators/A&P  Patient is a 64 year old male with history of hypertension, hep C, and gastritis likely related to alcohol consumption.  He presents today with epigastric pain that is reproducible with palpation.  His LFTs and lipase are unremarkable.  Patient given a GI cocktail with some relief.  He will undergo CT scan as the patient is concerned that he has not been having bowel movements and may be obstructed.  This study will be obtained and results reviewed by Dr. Silverio Lay.  He will then determine the final disposition based on the patient's clinical status and results of his imaging studies.  Final Clinical Impression(s) / ED Diagnoses Final diagnoses:  None    Rx / DC Orders ED Discharge Orders    None       Geoffery Lyons, MD 01/05/20 1601

## 2020-09-12 ENCOUNTER — Other Ambulatory Visit: Payer: Self-pay

## 2020-09-12 ENCOUNTER — Ambulatory Visit (INDEPENDENT_AMBULATORY_CARE_PROVIDER_SITE_OTHER): Payer: Medicaid Other

## 2020-09-12 ENCOUNTER — Ambulatory Visit: Payer: Medicaid Other | Admitting: Podiatry

## 2020-09-12 DIAGNOSIS — M7741 Metatarsalgia, right foot: Secondary | ICD-10-CM

## 2020-09-12 DIAGNOSIS — L989 Disorder of the skin and subcutaneous tissue, unspecified: Secondary | ICD-10-CM | POA: Diagnosis not present

## 2020-09-12 DIAGNOSIS — M216X1 Other acquired deformities of right foot: Secondary | ICD-10-CM

## 2020-09-12 DIAGNOSIS — L84 Corns and callosities: Secondary | ICD-10-CM

## 2020-09-12 DIAGNOSIS — M7742 Metatarsalgia, left foot: Secondary | ICD-10-CM | POA: Diagnosis not present

## 2020-09-25 NOTE — Progress Notes (Signed)
   Subjective: 65 y.o. male presenting to the office today as a new patient for evaluation of pain to bilateral feet secondary to corns and calluses.  Patient states that he soaks his feet for 20 minutes once a week help alleviate some of his pain.  He is also tried over-the-counter medications.  He states that he has had foot pain for over 10 years now.  He presents for further treatment and evaluation   Past Medical History:  Diagnosis Date  . Back pain   . Hypertension      Objective:  Physical Exam General: Alert and oriented x3 in no acute distress  Dermatology: Hyperkeratotic lesion(s) present on the bilateral feet. Pain on palpation with a central nucleated core noted. Skin is warm, dry and supple bilateral lower extremities. Negative for open lesions or macerations.  Vascular: Palpable pedal pulses bilaterally. No edema or erythema noted. Capillary refill within normal limits.  Neurological: Epicritic and protective threshold grossly intact bilaterally.   Musculoskeletal Exam: Pain on palpation at the keratotic lesion(s) noted. Range of motion within normal limits bilateral. Muscle strength 5/5 in all groups bilateral.  There is also pain on palpation throughout the entire forefoot consistent with the bilateral metatarsalgia  Radiographic exam: Normal osseous mineralization.  No fractures identified.  Joint spaces mostly preserved.  There are some arthritic changes to the first MTPJ bilateral.  Assessment: 1.  Preulcerative callus lesions bilateral feet 2.  Metatarsalgia bilateral   Plan of Care:  1. Patient evaluated 2. Excisional debridement of keratoic lesion(s) using a chisel blade was performed without incident.  3.  Continue trimming and soaking daily 4.  Offloading metatarsal pads applied to the insoles the bilateral feet  5.  Return to clinic as needed patient is to return to the clinic PRN.   Felecia Shelling, DPM [facility]  Dr. Felecia Shelling,  DPM    [address] [place], [place] [address]                Office [phone]  Fax [phone]

## 2021-10-16 IMAGING — CT CT ABD-PELV W/ CM
2 of 5 series · 16 of 46 positions shown, 18 images · IV contrast (omnipaque)
Comparison: 04/09/2018

CLINICAL DATA: Abdominal pain.  Epigastric pain x3 days.

EXAM:
CT ABDOMEN AND PELVIS WITH CONTRAST
TECHNIQUE: Multidetector CT imaging of the abdomen and pelvis was performed
using the standard protocol following bolus administration of
intravenous contrast.
CONTRAST:  100mL OMNIPAQUE IOHEXOL 300 MG/ML  SOLN

[Series 3: a/p w/ 5mm · axial · 0.69mm/px · z∈[-514,-108]mm · 13 of 93 slices shown, 15 images]
[im 6/93  soft-tissue]
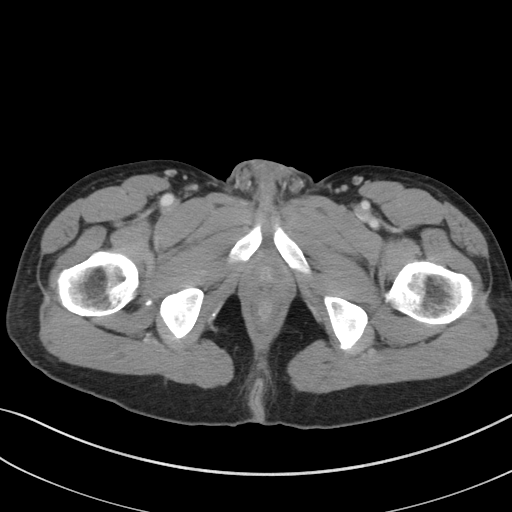
[im 6/93  bone]
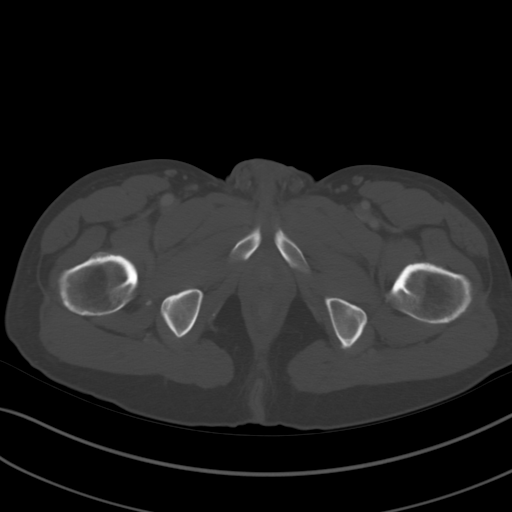
[im 11/93  soft-tissue]
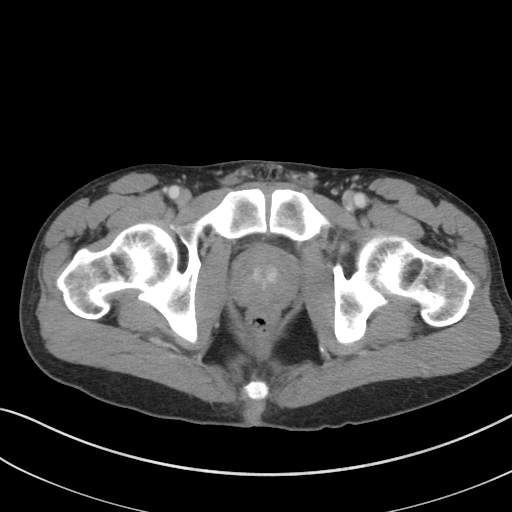
[im 21/93  soft-tissue]
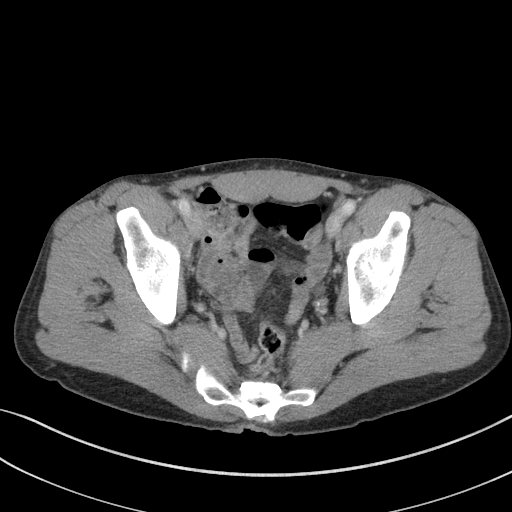
[im 26/93  soft-tissue]
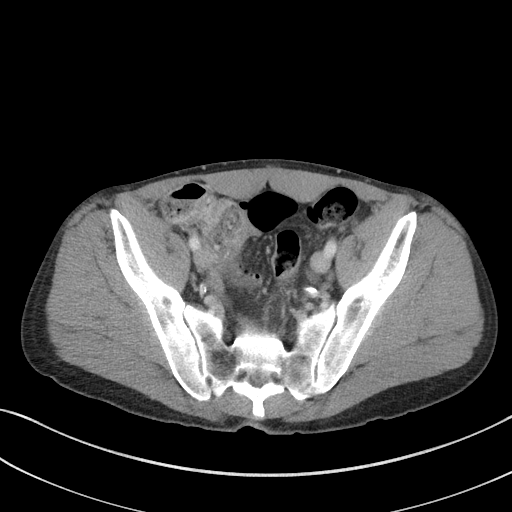
[im 31/93  soft-tissue]
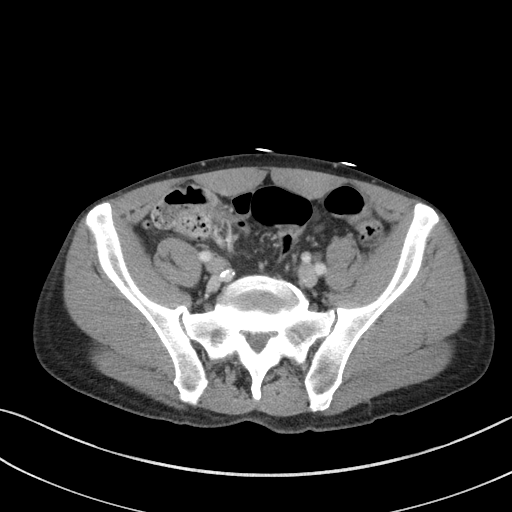
[im 41/93  soft-tissue]
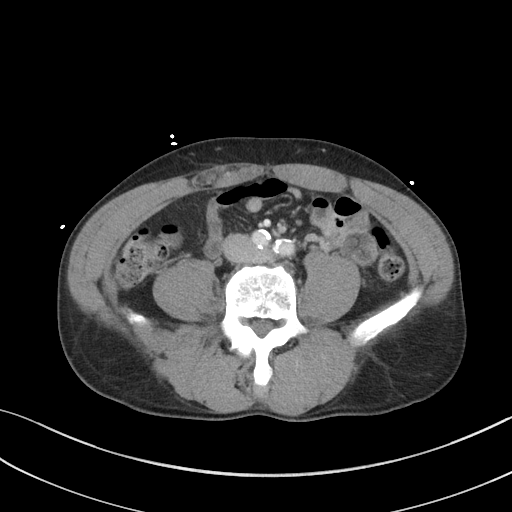
[im 47/93  soft-tissue]
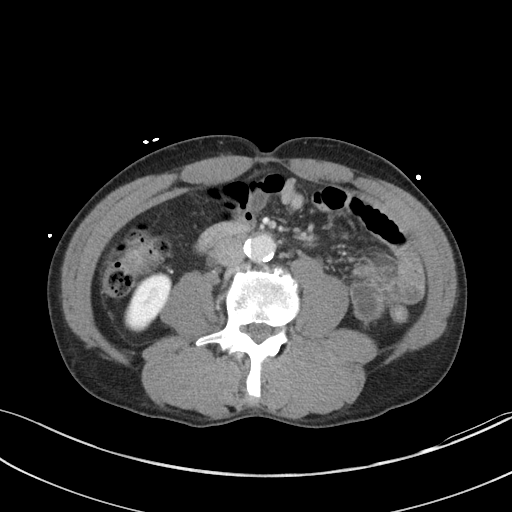
[im 52/93  soft-tissue]
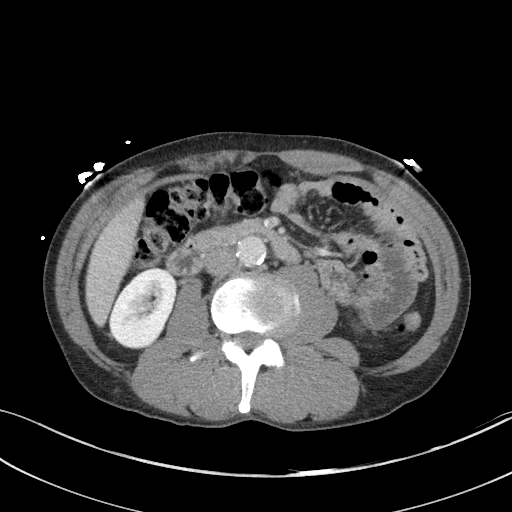
[im 62/93  soft-tissue]
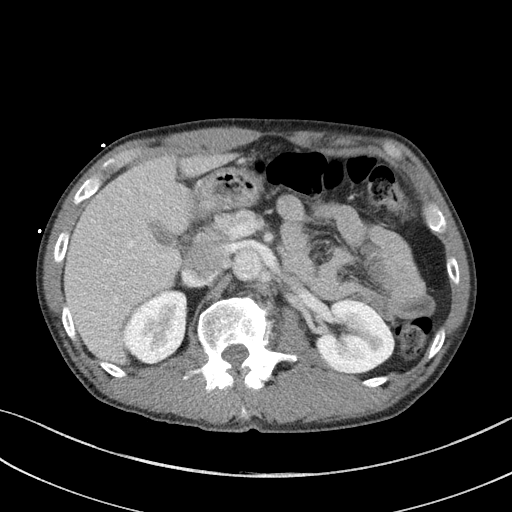
[im 62/93  bone]
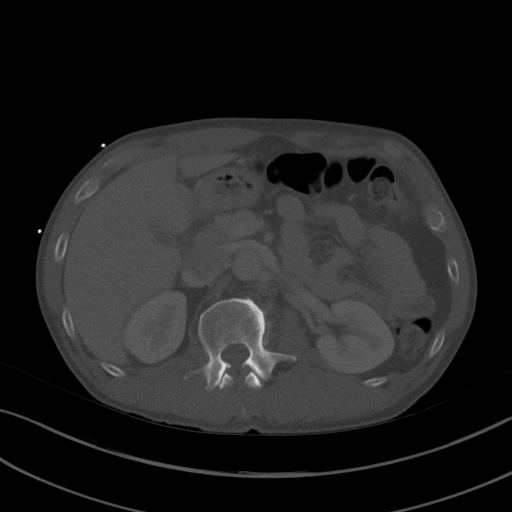
[im 67/93  soft-tissue]
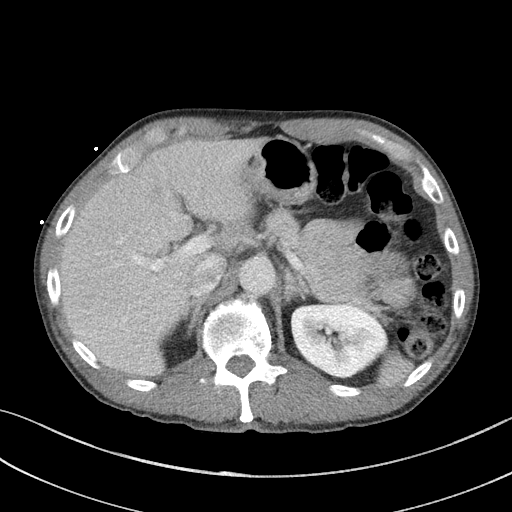
[im 72/93  soft-tissue]
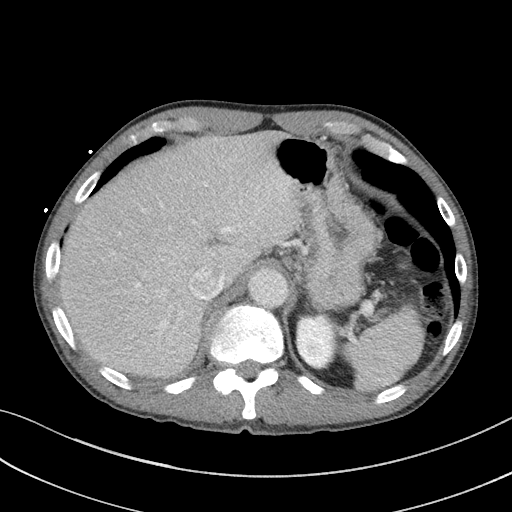
[im 82/93  soft-tissue]
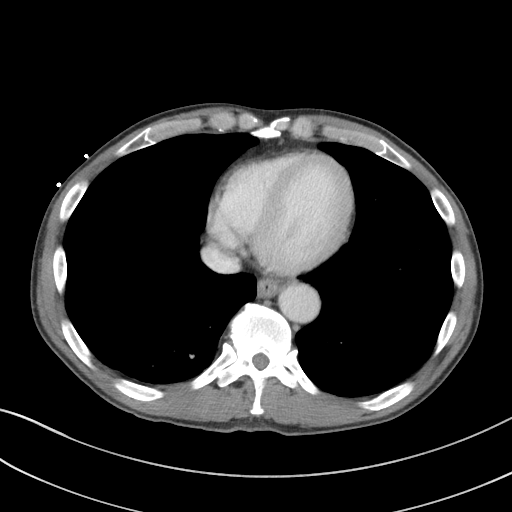
[im 87/93  soft-tissue]
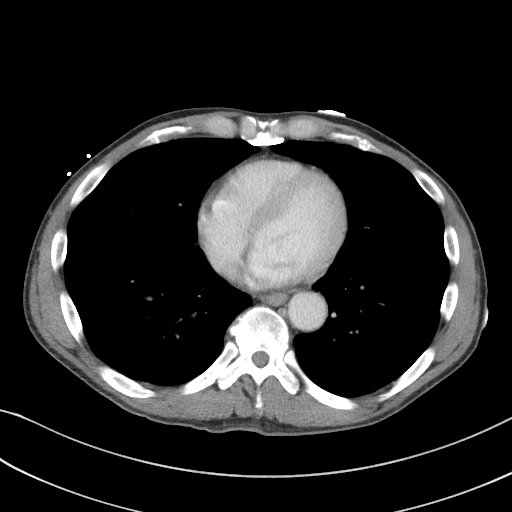

[Series 6: a/p w/ cor · coronal · 0.67mm/px · 3 of 121 slices shown]
[im 41/121  soft-tissue]
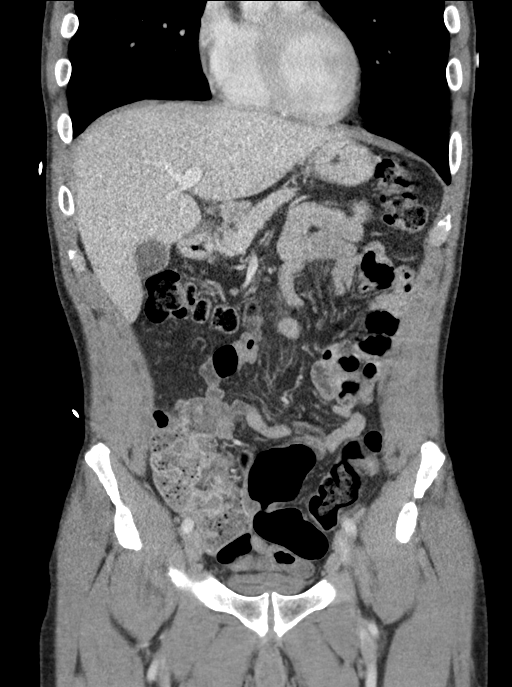
[im 54/121  soft-tissue]
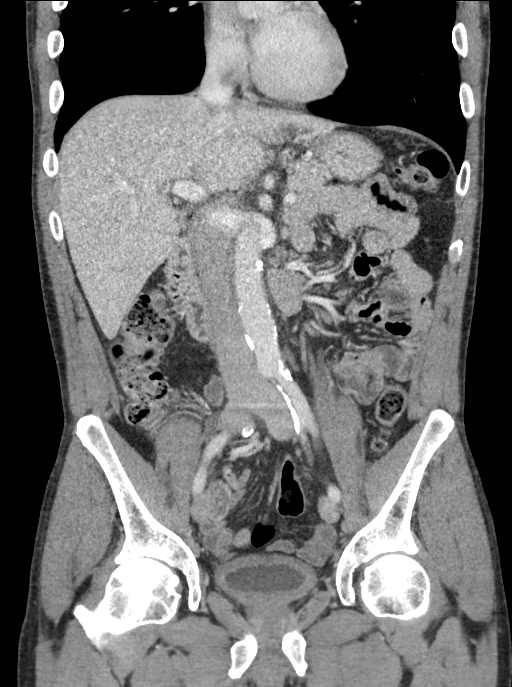
[im 67/121  soft-tissue]
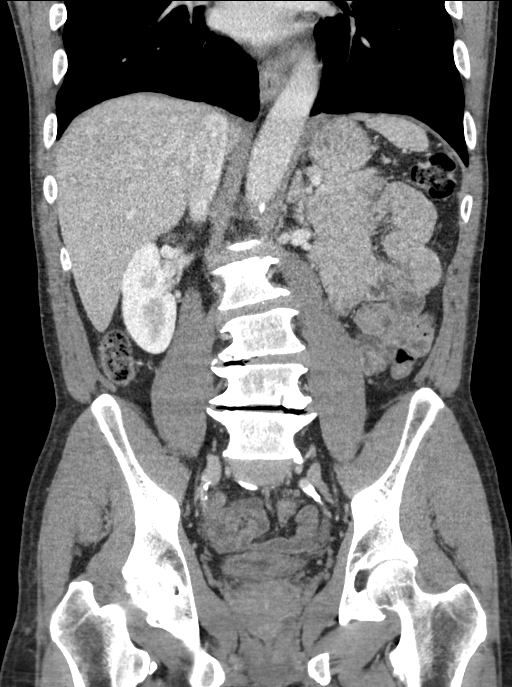

[16 of 46 positions shown; findings below may reference images not displayed]

FINDINGS: Lower chest: The lung bases are clear. The heart size is normal.

Hepatobiliary: The liver is normal. Normal gallbladder.There is no
biliary ductal dilation.

Pancreas: Normal contours without ductal dilatation. No
peripancreatic fluid collection.

Spleen: Unremarkable.

Adrenals/Urinary Tract:

--Adrenal glands: Unremarkable.

--Right kidney/ureter: No hydronephrosis or radiopaque kidney
stones.

--Left kidney/ureter: No hydronephrosis or radiopaque kidney stones.

--Urinary bladder: There is bladder wall thickening which is felt to
be secondary to underdistention.

Stomach/Bowel:

--Stomach/Duodenum: No hiatal hernia or other gastric abnormality.
Normal duodenal course and caliber.

--Small bowel: Unremarkable.

--Colon: Unremarkable.

--Appendix: Normal.

Vascular/Lymphatic: Atherosclerotic calcification is present within
the non-aneurysmal abdominal aorta, without hemodynamically
significant stenosis.

--No retroperitoneal lymphadenopathy.

--No mesenteric lymphadenopathy.

--No pelvic or inguinal lymphadenopathy.

Reproductive: The prostate gland is enlarged.

Other: No ascites or free air. The abdominal wall is normal.

Musculoskeletal. No acute displaced fractures.
IMPRESSION: No acute abnormality. No findings to explain the patient's symptoms.

Aortic Atherosclerosis ([id number]).
